# Patient Record
Sex: Female | Born: 1965 | Race: White | Hispanic: No | Marital: Married | Smoking: Former smoker
Health system: Southern US, Community
[De-identification: ages and names within clinical notes are randomized; demographics above are authoritative.]

## PROBLEM LIST (undated history)

## (undated) DIAGNOSIS — I10 Essential (primary) hypertension: Secondary | ICD-10-CM

## (undated) DIAGNOSIS — Z8669 Personal history of other diseases of the nervous system and sense organs: Secondary | ICD-10-CM

## (undated) DIAGNOSIS — E282 Polycystic ovarian syndrome: Secondary | ICD-10-CM

## (undated) DIAGNOSIS — R011 Cardiac murmur, unspecified: Secondary | ICD-10-CM

## (undated) DIAGNOSIS — E7800 Pure hypercholesterolemia, unspecified: Secondary | ICD-10-CM

## (undated) DIAGNOSIS — K219 Gastro-esophageal reflux disease without esophagitis: Secondary | ICD-10-CM

## (undated) DIAGNOSIS — F419 Anxiety disorder, unspecified: Secondary | ICD-10-CM

## (undated) DIAGNOSIS — E119 Type 2 diabetes mellitus without complications: Secondary | ICD-10-CM

## (undated) DIAGNOSIS — E039 Hypothyroidism, unspecified: Secondary | ICD-10-CM

## (undated) DIAGNOSIS — D649 Anemia, unspecified: Secondary | ICD-10-CM

## (undated) DIAGNOSIS — J45909 Unspecified asthma, uncomplicated: Secondary | ICD-10-CM

## (undated) HISTORY — DX: Pure hypercholesterolemia, unspecified: E78.00

## (undated) HISTORY — DX: Cardiac murmur, unspecified: R01.1

## (undated) HISTORY — DX: Anxiety disorder, unspecified: F41.9

## (undated) HISTORY — DX: Polycystic ovarian syndrome: E28.2

## (undated) HISTORY — PX: KNEE SURGERY: SHX244

## (undated) HISTORY — DX: Unspecified asthma, uncomplicated: J45.909

## (undated) HISTORY — DX: Gastro-esophageal reflux disease without esophagitis: K21.9

## (undated) HISTORY — DX: Personal history of other diseases of the nervous system and sense organs: Z86.69

## (undated) HISTORY — DX: Hypothyroidism, unspecified: E03.9

## (undated) HISTORY — DX: Anemia, unspecified: D64.9

## (undated) HISTORY — DX: Type 2 diabetes mellitus without complications: E11.9

## (undated) HISTORY — DX: Essential (primary) hypertension: I10

## (undated) HISTORY — PX: OTHER SURGICAL HISTORY: SHX169

---

## 2000-02-25 ENCOUNTER — Other Ambulatory Visit: Admission: RE | Admit: 2000-02-25 | Discharge: 2000-02-25 | Payer: Self-pay | Admitting: Family Medicine

## 2000-10-03 ENCOUNTER — Other Ambulatory Visit: Admission: RE | Admit: 2000-10-03 | Discharge: 2000-10-03 | Payer: Self-pay | Admitting: Obstetrics and Gynecology

## 2000-10-17 ENCOUNTER — Encounter (INDEPENDENT_AMBULATORY_CARE_PROVIDER_SITE_OTHER): Payer: Self-pay | Admitting: Specialist

## 2000-10-17 ENCOUNTER — Other Ambulatory Visit: Admission: RE | Admit: 2000-10-17 | Discharge: 2000-10-17 | Payer: Self-pay | Admitting: Obstetrics and Gynecology

## 2000-10-26 ENCOUNTER — Encounter: Payer: Self-pay | Admitting: Obstetrics and Gynecology

## 2000-10-26 ENCOUNTER — Ambulatory Visit (HOSPITAL_COMMUNITY): Admission: RE | Admit: 2000-10-26 | Discharge: 2000-10-26 | Payer: Self-pay | Admitting: Obstetrics and Gynecology

## 2001-03-25 ENCOUNTER — Inpatient Hospital Stay (HOSPITAL_COMMUNITY): Admission: AD | Admit: 2001-03-25 | Discharge: 2001-03-25 | Payer: Self-pay | Admitting: Obstetrics and Gynecology

## 2001-04-24 ENCOUNTER — Inpatient Hospital Stay (HOSPITAL_COMMUNITY): Admission: AD | Admit: 2001-04-24 | Discharge: 2001-04-24 | Payer: Self-pay | Admitting: Obstetrics and Gynecology

## 2001-05-22 ENCOUNTER — Ambulatory Visit (HOSPITAL_COMMUNITY): Admission: RE | Admit: 2001-05-22 | Discharge: 2001-05-22 | Payer: Self-pay | Admitting: Obstetrics and Gynecology

## 2001-05-22 ENCOUNTER — Encounter: Payer: Self-pay | Admitting: Obstetrics and Gynecology

## 2001-05-22 ENCOUNTER — Inpatient Hospital Stay (HOSPITAL_COMMUNITY): Admission: AD | Admit: 2001-05-22 | Discharge: 2001-05-22 | Payer: Self-pay | Admitting: Obstetrics and Gynecology

## 2001-06-29 ENCOUNTER — Encounter (INDEPENDENT_AMBULATORY_CARE_PROVIDER_SITE_OTHER): Payer: Self-pay

## 2001-06-29 ENCOUNTER — Ambulatory Visit (HOSPITAL_COMMUNITY): Admission: RE | Admit: 2001-06-29 | Discharge: 2001-06-29 | Payer: Self-pay | Admitting: Obstetrics and Gynecology

## 2001-06-30 ENCOUNTER — Encounter: Admission: RE | Admit: 2001-06-30 | Discharge: 2001-06-30 | Payer: Self-pay | Admitting: Family Medicine

## 2001-06-30 ENCOUNTER — Encounter: Payer: Self-pay | Admitting: Family Medicine

## 2001-07-16 ENCOUNTER — Inpatient Hospital Stay (HOSPITAL_COMMUNITY): Admission: AD | Admit: 2001-07-16 | Discharge: 2001-07-16 | Payer: Self-pay | Admitting: Obstetrics and Gynecology

## 2001-07-19 ENCOUNTER — Inpatient Hospital Stay (HOSPITAL_COMMUNITY): Admission: AD | Admit: 2001-07-19 | Discharge: 2001-07-19 | Payer: Self-pay | Admitting: Obstetrics and Gynecology

## 2001-07-21 ENCOUNTER — Inpatient Hospital Stay (HOSPITAL_COMMUNITY): Admission: AD | Admit: 2001-07-21 | Discharge: 2001-07-21 | Payer: Self-pay | Admitting: Obstetrics and Gynecology

## 2001-07-22 ENCOUNTER — Inpatient Hospital Stay (HOSPITAL_COMMUNITY): Admission: AD | Admit: 2001-07-22 | Discharge: 2001-07-22 | Payer: Self-pay | Admitting: Obstetrics and Gynecology

## 2001-08-12 ENCOUNTER — Inpatient Hospital Stay (HOSPITAL_COMMUNITY): Admission: AD | Admit: 2001-08-12 | Discharge: 2001-08-12 | Payer: Self-pay | Admitting: Obstetrics and Gynecology

## 2001-08-13 ENCOUNTER — Inpatient Hospital Stay (HOSPITAL_COMMUNITY): Admission: AD | Admit: 2001-08-13 | Discharge: 2001-08-13 | Payer: Self-pay | Admitting: Obstetrics and Gynecology

## 2002-07-03 ENCOUNTER — Other Ambulatory Visit: Admission: RE | Admit: 2002-07-03 | Discharge: 2002-07-03 | Payer: Self-pay | Admitting: *Deleted

## 2002-11-23 ENCOUNTER — Encounter: Admission: RE | Admit: 2002-11-23 | Discharge: 2002-11-23 | Payer: Self-pay | Admitting: Obstetrics and Gynecology

## 2003-01-10 ENCOUNTER — Inpatient Hospital Stay (HOSPITAL_COMMUNITY): Admission: AD | Admit: 2003-01-10 | Discharge: 2003-01-13 | Payer: Self-pay | Admitting: Obstetrics and Gynecology

## 2003-02-19 ENCOUNTER — Other Ambulatory Visit: Admission: RE | Admit: 2003-02-19 | Discharge: 2003-02-19 | Payer: Self-pay | Admitting: Obstetrics and Gynecology

## 2003-04-12 ENCOUNTER — Encounter: Admission: RE | Admit: 2003-04-12 | Discharge: 2003-04-12 | Payer: Self-pay | Admitting: Obstetrics and Gynecology

## 2003-04-12 ENCOUNTER — Encounter: Payer: Self-pay | Admitting: Obstetrics and Gynecology

## 2003-07-26 ENCOUNTER — Encounter: Admission: RE | Admit: 2003-07-26 | Discharge: 2003-07-26 | Payer: Self-pay | Admitting: Otolaryngology

## 2003-07-26 ENCOUNTER — Encounter: Payer: Self-pay | Admitting: Otolaryngology

## 2004-01-01 ENCOUNTER — Encounter: Admission: RE | Admit: 2004-01-01 | Discharge: 2004-01-01 | Payer: Self-pay | Admitting: Otolaryngology

## 2004-06-10 ENCOUNTER — Other Ambulatory Visit: Admission: RE | Admit: 2004-06-10 | Discharge: 2004-06-10 | Payer: Self-pay | Admitting: Obstetrics and Gynecology

## 2004-08-20 ENCOUNTER — Encounter: Admission: RE | Admit: 2004-08-20 | Discharge: 2004-08-20 | Payer: Self-pay | Admitting: Family Medicine

## 2005-01-01 ENCOUNTER — Encounter: Admission: RE | Admit: 2005-01-01 | Discharge: 2005-01-01 | Payer: Self-pay | Admitting: Gastroenterology

## 2005-07-07 ENCOUNTER — Ambulatory Visit: Payer: Self-pay | Admitting: Endocrinology

## 2005-09-25 ENCOUNTER — Emergency Department (HOSPITAL_COMMUNITY): Admission: EM | Admit: 2005-09-25 | Discharge: 2005-09-25 | Payer: Self-pay | Admitting: Emergency Medicine

## 2005-11-05 ENCOUNTER — Encounter: Admission: RE | Admit: 2005-11-05 | Discharge: 2005-11-05 | Payer: Self-pay | Admitting: Obstetrics and Gynecology

## 2005-11-12 ENCOUNTER — Ambulatory Visit (HOSPITAL_COMMUNITY): Admission: RE | Admit: 2005-11-12 | Discharge: 2005-11-12 | Payer: Self-pay | Admitting: Sports Medicine

## 2006-10-25 ENCOUNTER — Ambulatory Visit: Payer: Self-pay | Admitting: Endocrinology

## 2006-10-25 LAB — CONVERTED CEMR LAB
Chloride: 104 meq/L (ref 96–112)
Creatinine, Ser: 0.8 mg/dL (ref 0.4–1.2)
Glucose, Bld: 116 mg/dL — ABNORMAL HIGH (ref 70–99)
Sodium: 138 meq/L (ref 135–145)

## 2006-12-29 ENCOUNTER — Encounter: Admission: RE | Admit: 2006-12-29 | Discharge: 2006-12-29 | Payer: Self-pay | Admitting: Obstetrics and Gynecology

## 2007-04-28 ENCOUNTER — Ambulatory Visit (HOSPITAL_COMMUNITY): Admission: RE | Admit: 2007-04-28 | Discharge: 2007-04-28 | Payer: Self-pay | Admitting: Obstetrics and Gynecology

## 2007-06-20 ENCOUNTER — Encounter: Payer: Self-pay | Admitting: Endocrinology

## 2007-06-20 DIAGNOSIS — E119 Type 2 diabetes mellitus without complications: Secondary | ICD-10-CM | POA: Insufficient documentation

## 2007-06-20 DIAGNOSIS — J45909 Unspecified asthma, uncomplicated: Secondary | ICD-10-CM | POA: Insufficient documentation

## 2007-06-20 DIAGNOSIS — K219 Gastro-esophageal reflux disease without esophagitis: Secondary | ICD-10-CM

## 2007-06-20 HISTORY — DX: Unspecified asthma, uncomplicated: J45.909

## 2007-06-20 HISTORY — DX: Gastro-esophageal reflux disease without esophagitis: K21.9

## 2007-06-20 HISTORY — DX: Type 2 diabetes mellitus without complications: E11.9

## 2007-08-17 ENCOUNTER — Inpatient Hospital Stay (HOSPITAL_COMMUNITY): Admission: AD | Admit: 2007-08-17 | Discharge: 2007-08-18 | Payer: Self-pay | Admitting: Obstetrics and Gynecology

## 2007-08-28 ENCOUNTER — Encounter (INDEPENDENT_AMBULATORY_CARE_PROVIDER_SITE_OTHER): Payer: Self-pay | Admitting: Obstetrics and Gynecology

## 2007-08-28 ENCOUNTER — Inpatient Hospital Stay (HOSPITAL_COMMUNITY): Admission: AD | Admit: 2007-08-28 | Discharge: 2007-09-01 | Payer: Self-pay | Admitting: Obstetrics and Gynecology

## 2008-01-24 ENCOUNTER — Telehealth (INDEPENDENT_AMBULATORY_CARE_PROVIDER_SITE_OTHER): Payer: Self-pay | Admitting: *Deleted

## 2008-01-26 ENCOUNTER — Ambulatory Visit: Payer: Self-pay | Admitting: Endocrinology

## 2008-01-26 DIAGNOSIS — E039 Hypothyroidism, unspecified: Secondary | ICD-10-CM | POA: Insufficient documentation

## 2008-01-26 HISTORY — DX: Hypothyroidism, unspecified: E03.9

## 2008-01-26 LAB — CONVERTED CEMR LAB
BUN: 10 mg/dL (ref 6–23)
CO2: 27 meq/L (ref 19–32)
Chloride: 107 meq/L (ref 96–112)
Creatinine, Ser: 0.9 mg/dL (ref 0.4–1.2)
Glucose, Bld: 113 mg/dL — ABNORMAL HIGH (ref 70–99)
Hgb A1c MFr Bld: 6.1 % — ABNORMAL HIGH (ref 4.6–6.0)

## 2008-02-09 ENCOUNTER — Ambulatory Visit (HOSPITAL_COMMUNITY): Admission: RE | Admit: 2008-02-09 | Discharge: 2008-02-09 | Payer: Self-pay | Admitting: Obstetrics and Gynecology

## 2008-04-04 ENCOUNTER — Encounter: Admission: RE | Admit: 2008-04-04 | Discharge: 2008-04-04 | Payer: Self-pay | Admitting: Obstetrics and Gynecology

## 2008-11-15 ENCOUNTER — Ambulatory Visit: Payer: Self-pay | Admitting: Endocrinology

## 2008-11-15 DIAGNOSIS — I10 Essential (primary) hypertension: Secondary | ICD-10-CM | POA: Insufficient documentation

## 2008-11-15 HISTORY — DX: Essential (primary) hypertension: I10

## 2008-11-20 ENCOUNTER — Ambulatory Visit: Payer: Self-pay | Admitting: Endocrinology

## 2008-11-20 LAB — CONVERTED CEMR LAB: TSH: 25.26 microintl units/mL — ABNORMAL HIGH (ref 0.35–5.50)

## 2008-11-26 ENCOUNTER — Telehealth (INDEPENDENT_AMBULATORY_CARE_PROVIDER_SITE_OTHER): Payer: Self-pay | Admitting: *Deleted

## 2008-11-28 ENCOUNTER — Ambulatory Visit: Payer: Self-pay | Admitting: Endocrinology

## 2008-12-01 LAB — CONVERTED CEMR LAB: Cortisol, Plasma: 1.1 ug/dL

## 2008-12-03 ENCOUNTER — Telehealth (INDEPENDENT_AMBULATORY_CARE_PROVIDER_SITE_OTHER): Payer: Self-pay | Admitting: *Deleted

## 2008-12-05 ENCOUNTER — Ambulatory Visit: Payer: Self-pay | Admitting: Endocrinology

## 2008-12-09 ENCOUNTER — Encounter: Payer: Self-pay | Admitting: Endocrinology

## 2008-12-17 LAB — CONVERTED CEMR LAB: Volume, Urine-CORTUR: 1200 mL

## 2009-01-08 ENCOUNTER — Encounter: Payer: Self-pay | Admitting: Endocrinology

## 2009-01-29 ENCOUNTER — Ambulatory Visit (HOSPITAL_COMMUNITY): Admission: RE | Admit: 2009-01-29 | Discharge: 2009-01-29 | Payer: Self-pay | Admitting: Surgery

## 2009-01-29 ENCOUNTER — Encounter: Admission: RE | Admit: 2009-01-29 | Discharge: 2009-01-29 | Payer: Self-pay | Admitting: Surgery

## 2009-02-11 ENCOUNTER — Ambulatory Visit (HOSPITAL_COMMUNITY): Admission: RE | Admit: 2009-02-11 | Discharge: 2009-02-11 | Payer: Self-pay | Admitting: Surgery

## 2009-04-24 ENCOUNTER — Encounter: Admission: RE | Admit: 2009-04-24 | Discharge: 2009-04-24 | Payer: Self-pay | Admitting: Obstetrics and Gynecology

## 2009-04-24 ENCOUNTER — Encounter: Admission: RE | Admit: 2009-04-24 | Discharge: 2009-07-23 | Payer: Self-pay | Admitting: Surgery

## 2009-05-13 ENCOUNTER — Ambulatory Visit (HOSPITAL_COMMUNITY): Admission: RE | Admit: 2009-05-13 | Discharge: 2009-05-14 | Payer: Self-pay | Admitting: Surgery

## 2009-05-13 HISTORY — PX: LAPAROSCOPIC GASTRIC BANDING: SHX1100

## 2009-05-16 ENCOUNTER — Telehealth: Payer: Self-pay | Admitting: Endocrinology

## 2009-05-20 ENCOUNTER — Ambulatory Visit: Payer: Self-pay | Admitting: Endocrinology

## 2009-05-21 LAB — CONVERTED CEMR LAB: TSH: 7.07 microintl units/mL — ABNORMAL HIGH (ref 0.35–5.50)

## 2010-01-05 ENCOUNTER — Ambulatory Visit: Payer: Self-pay | Admitting: Endocrinology

## 2010-01-05 DIAGNOSIS — E7800 Pure hypercholesterolemia, unspecified: Secondary | ICD-10-CM

## 2010-01-05 HISTORY — DX: Pure hypercholesterolemia, unspecified: E78.00

## 2010-01-05 LAB — CONVERTED CEMR LAB
HDL: 52.2 mg/dL (ref >39.00)
TSH: 2.86 microintl units/mL (ref 0.35–5.50)
Total CHOL/HDL Ratio: 3

## 2010-01-16 ENCOUNTER — Ambulatory Visit (HOSPITAL_COMMUNITY): Admission: RE | Admit: 2010-01-16 | Discharge: 2010-01-16 | Payer: Self-pay | Admitting: Obstetrics and Gynecology

## 2010-10-25 ENCOUNTER — Encounter: Payer: Self-pay | Admitting: Obstetrics and Gynecology

## 2010-10-25 ENCOUNTER — Encounter: Payer: Self-pay | Admitting: Gastroenterology

## 2010-11-05 NOTE — Assessment & Plan Note (Signed)
Summary: f/u appt/#/cd   Vital Signs:  Patient profile:   45 year old female Height:      62 inches (157.48 cm) Weight:      237.50 pounds (107.95 kg) O2 Sat:      98 % on Room air Temp:     99.0 degrees F (37.22 degrees C) oral Pulse rate:   74 / minute BP sitting:   128 / 72  (left arm) Cuff size:   large  Vitals Entered By: Josph Macho RMA (January 05, 2010 3:20 PM)  O2 Flow:  Room air CC: Follow-up visit/ pt states she is no longer taking Topamax/ CF   CC:  Follow-up visit/ pt states she is no longer taking Topamax/ CF.  History of Present Illness: pt says she has hit a "plateau," since her "lap-band" surgery.  she has lost 43 lbs so far, out of a goal of 75 lb.   she says her menses are not more frequent, but are heavier since the "lap-band" surgery. she says she has had a high cholesterol in the past, but not recently.  Current Medications (verified): 1)  Omeprazole 20 Mg Tbec (Omeprazole) .Marland Kitchen.. 1 By Mouth Once Daily 2)  Paxil Cr 25 Mg  Tb24 (Paroxetine Hcl) .... Take 1 By Mouth Qd 3)  Alprazolam 0.25 Mg  Tabs (Alprazolam) .... Take 1 By Mouth Once Daily Prn 4)  Synthroid 100 Mcg Tabs (Levothyroxine Sodium) .... Qd 5)  Topamax 25 Mg Tabs (Topiramate) .Marland Kitchen.. 1 Tab Daily To Prevent Migraines  Allergies (verified): 1)  ! Sulfa  Past History:  Past Medical History: Last updated: 06/20/2007 Diabetes mellitus, type II GERD Polycystic ovary syndrome  Review of Systems       she has regurgitation infrequently.  denies painful swallowing.  Physical Exam  General:  obese.  no distress  Neck:  no masses, thyromegaly, or abnormal cervical nodes Additional Exam:  LDL Cholesterol           87 mg/dL                    0-45 FastTSH                   2.86 uIU/mL                 0.35-5.50 Hemoglobin A1C            5.8 %      Impression & Recommendations:  Problem # 1:  HYPERCHOLESTEROLEMIA (ICD-272.0) well-controlled  Problem # 2:  HYPERTENSION  (ICD-401.9) well-controlled  Problem # 3:  HYPOTHYROIDISM (ICD-244.9) well-replaced  Problem # 4:  DIABETES MELLITUS, TYPE II (ICD-250.00) well-controlled on no medication  Medications Added to Medication List This Visit: 1)  Omeprazole 20 Mg Tbec (Omeprazole) .Marland Kitchen.. 1 by mouth once daily  Other Orders: TLB-Lipid Panel (80061-LIPID) TLB-TSH (Thyroid Stimulating Hormone) (84443-TSH) TLB-A1C / Hgb A1C (Glycohemoglobin) (83036-A1C) Est. Patient Level IV (40981)  Patient Instructions: 1)  tests are being ordered for you today.  a few days after the test(s), please call [phone] to hear your test results. 2)  pending the test results, please continue the same medications for now 3)  cc drs martin and grewal. 4)  Please schedule a follow-up appointment in 6 months.

## 2010-12-23 LAB — CBC
MCHC: 32.9 g/dL (ref 30.0–36.0)
RBC: 4.34 MIL/uL (ref 3.87–5.11)

## 2010-12-23 LAB — PREGNANCY, URINE: Preg Test, Ur: NEGATIVE

## 2011-01-09 LAB — COMPREHENSIVE METABOLIC PANEL
ALT: 118 U/L — ABNORMAL HIGH (ref 0–35)
Alkaline Phosphatase: 51 U/L (ref 39–117)
BUN: 15 mg/dL (ref 6–23)
CO2: 23 mEq/L (ref 19–32)
Calcium: 9.1 mg/dL (ref 8.4–10.5)
GFR calc non Af Amer: >60 mL/min (ref >60)
Glucose, Bld: 128 mg/dL — ABNORMAL HIGH (ref 70–99)
Sodium: 139 mEq/L (ref 135–145)
Total Protein: 7.6 g/dL (ref 6.0–8.3)

## 2011-01-09 LAB — GLUCOSE, CAPILLARY
Glucose-Capillary: 108 mg/dL — ABNORMAL HIGH (ref 70–99)
Glucose-Capillary: 119 mg/dL — ABNORMAL HIGH (ref 70–99)
Glucose-Capillary: 127 mg/dL — ABNORMAL HIGH (ref 70–99)
Glucose-Capillary: 173 mg/dL — ABNORMAL HIGH (ref 70–99)

## 2011-01-09 LAB — DIFFERENTIAL
Basophils Absolute: 0 10*3/uL (ref 0.0–0.1)
Basophils Relative: 1 % (ref 0–1)
Eosinophils Absolute: 0.1 10*3/uL (ref 0.0–0.7)
Lymphocytes Relative: 18 % (ref 12–46)
Lymphs Abs: 1.1 10*3/uL (ref 0.7–4.0)
Lymphs Abs: 1.3 10*3/uL (ref 0.7–4.0)
Monocytes Absolute: 0.5 10*3/uL (ref 0.1–1.0)
Monocytes Relative: 7 % (ref 3–12)
Neutro Abs: 5.2 10*3/uL (ref 1.7–7.7)
Neutro Abs: 5.5 10*3/uL (ref 1.7–7.7)
Neutrophils Relative %: 77 % (ref 43–77)

## 2011-01-09 LAB — CBC
HCT: 35.9 % — ABNORMAL LOW (ref 36.0–46.0)
HCT: 36.3 % (ref 36.0–46.0)
Hemoglobin: 11.7 g/dL — ABNORMAL LOW (ref 12.0–15.0)
Hemoglobin: 12 g/dL (ref 12.0–15.0)
MCHC: 32.9 g/dL (ref 30.0–36.0)
RBC: 4.11 MIL/uL (ref 3.87–5.11)
RBC: 4.21 MIL/uL (ref 3.87–5.11)
RDW: 16.3 % — ABNORMAL HIGH (ref 11.5–15.5)

## 2011-01-09 LAB — PREGNANCY, URINE: Preg Test, Ur: NEGATIVE

## 2011-01-27 ENCOUNTER — Other Ambulatory Visit: Payer: Self-pay | Admitting: Endocrinology

## 2011-02-16 NOTE — Op Note (Signed)
NAMEJAIRA, CANADY             ACCOUNT NO.:  [id number]   MEDICAL RECORD NO.:  [id number]          PATIENT TYPE:  INP   LOCATION:  9109                          FACILITY:  [facility]   PHYSICIAN:  Duke Salvia. Marcelle Overlie, M.D.DATE OF BIRTH:  Aug 28, 1966   DATE OF PROCEDURE:  08/28/2007  DATE OF DISCHARGE:                               OPERATIVE REPORT   PREOPERATIVE DIAGNOSES:  1. Previous cesarean section.  2. For repeat cesarean section.   POSTOPERATIVE DIAGNOSES:  1. Previous cesarean section.  2. For repeat cesarean section.   PROCEDURE:  Repeat low transverse cesarean section.   SURGEON:  Duke Salvia. Marcelle Overlie, M.D.   ANESTHESIA:  Spinal.   COMPLICATIONS:  None.   DRAINS:  Foley catheter.   BLOOD LOSS:  800   PROCEDURE AND FINDINGS:  The patient was taken to the operating room and  after an adequate level of spinal anesthetic was obtained with the  patient in a left tilt position, the abdomen was prepped and draped in  the usual manner for sterile abdominal procedures.  A Foley catheter was  positioned, draining clear urine.  A Pfannenstiel incision was made  through the old scar, which was well healed; this was carried down to  the fascia, which was incised and extended transversely.  Rectus muscle  was divided in the midline.  Peritoneum was entered superiorly without  incident and extended vertically.  Several omental adhesions were doubly  clamped, divided and free-tied to advance the adherent omentum  superiorly.  Once this was accomplished, the bladder blade was inserted.  Vesicouterine serosa was then incised and the bladder was bluntly and  sharply dissected below, bladder blade repositioned.   A transverse incision was made in the lower segment and extended with  blunt dissection.  The patient was delivered of a 7-pound 10-ounce female,  cord pH 7.35, Apgars 9 and 9.  The infant was suctioned, cord clamped  and infant passed to the pediatric team for further care.  The  placenta  was delivered manually intact, sent to Pathology.  Uterus was  exteriorized and cavity wiped clean with a laparotomy pack.  Closure was  obtained with a first layer of 0 chromic followed by an imbricating  layer of 0 chromic; this was hemostatic.  The bladder flap area was  intact and hemostatic.  Tubes and ovaries were normal.  Prior to  closure, sponge, needle and instrument counts were reported as correct  x2.  Peritoneum was closed with a running 3-0 Vicryl suture.  Fascia was  closed from laterally to midline  on either side with a 0 PDS suture.  A 3-0 Vicryl was used in running  fashion to reapproximate the subcutaneous tissue, which was hemostatic.  Clips used on the skin.  She did receive preop Ancef 1 g IV Pitocin and  after the cord was clamped.  Clear urine was noted at the end of the  case.  Mother and baby were doing well at that point.      Richard M. Marcelle Overlie, M.D.  Electronically Signed     RMH/MEDQ  D:  08/28/2007  T:  08/29/2007  Job:  [id number]

## 2011-02-16 NOTE — Op Note (Signed)
Jordan Holloway, Jordan Holloway             ACCOUNT NO.:  [id number]   MEDICAL RECORD NO.:  [id number]          PATIENT TYPE:  OIB   LOCATION:  1525                         FACILITY:  [facility]   PHYSICIAN:  Thornton Park. Daphine Deutscher, MD  DATE OF BIRTH:  [birth year]   DATE OF PROCEDURE:  05/13/2009  DATE OF DISCHARGE:                               OPERATIVE REPORT   PREOPERATIVE INDICATIONS:  A 45 year old lady, BMI 49, positive  gastroesophageal reflux disease and small hiatal hernia with diabetes  mellitus.   PROCEDURE:  Laparoscopic adjustable gastric banding (APL) with hiatal  hernia repair (two posterior sutures).   SURGEON:  Luretha Murphy, M.D.   ASSISTANT:  Ovidio Kin, M.D.   ANESTHESIA:  General endotracheal.   DESCRIPTION OF PROCEDURE:  [name] was taken to room 1 on May 13, 2009, given general anesthesia.  The abdomen was prepped with a Techni-  Care equivalent and draped sterilely.  I entered the abdomen through the  left upper quadrant using an OptiVu 12 entering without difficulty and  then insufflating.  Once insufflation was established, pneumoperitoneum  was present.  I placed standard trocar placements including 11-12 to the  left of the umbilicus with the camera and two operating ports on the  right including a 15 upper and 12 lower and 5 mm in the upper midline  for the Nathanson's retractor.  Dr. Ezzard Standing placed a second 5 mm lateral  on the left side.  The patient had a slightly fatty liver.  We were able  to expose the proximal foregut, we then did a complete dissection and  opened up the window on the right and then taking down the  phrenoesophageal ligament and refining a hiatal hernia and getting that  reduced.  With that reduced, I then repaired the hiatus posteriorly with  two sutures using the Endo stitch and Ti-Knots.   Next we went ahead and went down a little lower, passed the band passer  and placed an APL band which was engaged and plicated anteriorly with  three sutures using Surgidac and Ti-Knots.  The port was then connected  to the  port and placed in the subcutaneous tissue on the right and then closed  with 4-0 Vicryl subcutaneously with Benzoin and Steri-Strips.  Ports  were all injected with Marcaine.  The patient seemed to tolerate the  procedure well and was taken to recovery room in satisfactory condition.      Thornton Park Daphine Deutscher, MD  Electronically Signed     MBM/MEDQ  D:  05/13/2009  T:  05/13/2009  Job:  [id number]   cc:   Gregary Signs A. Everardo All, MD  [address]  [place]  [place] [address]   [name], M.D.  Fax: [phone]

## 2011-02-16 NOTE — Op Note (Signed)
NAME:  Jordan Holloway, Jordan Holloway             ACCOUNT NO.:  [id number]   MEDICAL RECORD NO.:  [id number]          PATIENT TYPE:  AMB   LOCATION:  SDC                           FACILITY:  [facility]   PHYSICIAN:  Michelle L. Grewal, M.D.DATE OF BIRTH:  [birth year]   DATE OF PROCEDURE:  02/09/2008  DATE OF DISCHARGE:                               OPERATIVE REPORT   PREOPERATIVE DIAGNOSIS:  Retained intrauterine device.   POSTOPERATIVE DIAGNOSIS:  Retained intrauterine device.   PROCEDURE:  Cervical dilation and intrauterine device removal.   SURGEON:  Michelle L. Vincente Poli, MD   ANESTHESIA:  MAC.   FINDINGS:  Retained IUD.   EBL:  Minimal.   PROCEDURE:  The patient was taken to the operating room.  After informed  consent was obtained, she was then prepped and draped in the usual  sterile fashion.  In-and-out catheter was used to empty the bladder.  The cervix was grasped with a tenaculum.  Paracervical block was  performed.  The cervical internal os was gently dilated using Pratt  dilators.  A sharp curette was inserted and the IUD was removed with a  second attempt without difficulty.  Strings were intact.  The IUD was  intact.  All instruments were removed from the vagina.  All sponge, lap,  and instrument counts were correct x2.  The patient went to recovery  room in stable condition.      Michelle L. Vincente Poli, M.D.  Electronically Signed     MLG/MEDQ  D:  02/09/2008  T:  02/10/2008  Job:  [id number]

## 2011-02-19 NOTE — Consult Note (Signed)
[facility]                          ENDOCRINOLOGY CONSULTATION   NAME:Holloway, Jordan AYUSO                    MRN:          [id number]  DATE:10/25/2006                            DOB:          07-30-1966    REASON FOR VISIT:  Follow up polycystic ovary syndrome.   HISTORY OF PRESENT ILLNESS:  A 45 year old woman who is currently taking  her extended-release metformin 1000 mg a day.  She states she has a  limited amount of time left to try to begin her pregnancy and she wants  to pursue this.   PAST MEDICAL HISTORY:  Otherwise healthy.   REVIEW OF SYSTEMS:  She has gained several pounds since she was here  last year.   PHYSICAL EXAMINATION:  VITAL SIGNS:  Blood pressure 143/88, heart rate  92, temperature is 98.7, the weight is 253.  GENERAL:  No distress.  EXTREMITIES:  No edema.   LABORATORY STUDIES:  BMET normal except for random glucose 116.  Hemoglobin A1c 6.0.   IMPRESSION:  1. Polycystic ovary syndrome.  2. Although the hemoglobin A1c is not an official criterion for      diabetes, virtually all patients with a hemoglobin A1c of 6.0 do      fulfill official criteria when exhaustively tested.   PLAN:  1. Add Actos 45 mg a day.  2. Increase Glucophage XR 1000 mg twice a day.  3. Blood pressure check with Dr. Arvilla Market soon.     Sean A. Everardo All, MD  Electronically Signed    SAE/MedQ  DD: 10/27/2006  DT: 10/27/2006  Job #: [id number]   cc:   Jordan Holloway, M.D.  Jordan Holloway, M.D.

## 2011-02-19 NOTE — Discharge Summary (Signed)
NAMEJAQUELINE, UBER             ACCOUNT NO.:  [id number]   MEDICAL RECORD NO.:  [id number]          PATIENT TYPE:  INP   LOCATION:  9109                          FACILITY:  [facility]   PHYSICIAN:  Julio Sicks, N.P.     DATE OF BIRTH:  1965-12-05   DATE OF ADMISSION:  08/28/2007  DATE OF DISCHARGE:  09/01/2007                               DISCHARGE SUMMARY   ADMISSION DATE:  1. Intrauterine pregnancy at term.  2. Previous cesarean section, desires repeat.  3. Gestational diabetes managed with metformin.  4. Nonreassuring fetal heart tones.   DISCHARGE DIAGNOSIS:  1. Status post low transverse cesarean section.  2. Viable female infant.   PROCEDURE:  Repeat low transverse cesarean section.   REASON FOR ADMISSION:  Please see written H&P.   HOSPITAL COURSE:  The patient was a 45 year old gravida 2, para 1, who  was admitted to [facility] for a repeat low transverse  cesarean section.  The patient had been scheduled for a repeat cesarean  delivery in approximately 2 days, however, the patient had been seen in  the office on the day of admission where an audible deceleration of  fetal heart tones were auscultated.  The patient was now admitted for  delivery.  The patient was then transferred to the operating room where  spinal anesthesia was administered without difficulty.  A low transverse  incision was made with delivery of a viable female infant weighing 7  pounds 10 ounces, with Apgars of 9 at one minute and 9 at five minutes.  Arterial cord pH was 7.35.  The patient tolerated the procedure well and  was taken to the recovery room in stable condition.   Postoperative day #1 the patient was without complaint.  Vital signs  were stable.  She is afebrile.  Abdomen soft with good return of bowel  function.  Fundus is firm and nontender.  Abdominal dressing was noted  to be clean, dry and intact.   Laboratory findings revealed hemoglobin of 10.4, platelet count  133,000,  WBC count of 6.9.  Blood type was known to be A+.  Fasting blood sugar  was 143 mg/dL.   On postoperative day #2 the patient was without complaint.  Vital signs  were stable.  She is afebrile.  Abdomen soft.  Fundus firm and  nontender.  Incision was clean, dry and intact.  She is ambulating well.  On postoperative day #3 the patient continued to be without complaint.  Vital signs remained stable.  She is afebrile.  Fundus firm and  nontender.  On postoperative day #4 the patient was without complaint.  Vital signs were stable.  Fundus firm and nontender.  Incision was  clean, dry and intact.  Staples were left in place.  Discharge  instructions were reviewed and the patient was later discharged home.   CONDITION ON DISCHARGE:  Stable.   DIET:  Regular as tolerated.   ACTIVITY:  No heavy lifting, no driving times two weeks, no vaginal  entry.   FOLLOW UP:  Patient is to followup in the office in 2-3 days for staple  removal.  She is to call for temperature greater than 100 degrees,  persistent nausea, vomiting, heavy vaginal bleeding and/or redness or  drainage from incisional site.   DISCHARGE MEDICATIONS:  1. Tylox #30 1 to 2 every 4 to 6 hours p.r.n. pain.  2. Ibuprofen 600 mg every 6 hours.  3. Prenatal vitamins 1 p.o. daily.  4. Colace 1 p.o. daily p.r.n.      Julio Sicks, N.P.     CC/MEDQ  D:  10/04/2007  T:  10/04/2007  Job:  [id number]

## 2011-02-19 NOTE — Op Note (Signed)
NAME:  Jordan Holloway, Jordan Holloway                       ACCOUNT NO.:  [id number]   MEDICAL RECORD NO.:  [id number]                   PATIENT TYPE:  INP   LOCATION:  9129                                 FACILITY:  [facility]   PHYSICIAN:  Michelle L. Vincente Poli, M.D.            DATE OF BIRTH:  01-27-1966   DATE OF PROCEDURE:  01/10/2003  DATE OF DISCHARGE:                                 OPERATIVE REPORT   PREOPERATIVE DIAGNOSES:  1. Intrauterine pregnancy at 38 weeks.  2. Gestational diabetes on insulin.  3. Breech presentation.   POSTOPERATIVE DIAGNOSES:  1. Intrauterine pregnancy at 38 weeks.  2. Gestational diabetes on insulin.  3. Breech presentation.   PROCEDURE:  Primary low transverse cesarean section.   SURGEON:  Jerre Simon, M.D.   ANESTHESIA:  Spinal.   FINDINGS:  Female infant, Apgars 9 at one minute, 9 at five minutes despite  breech presentation.   ESTIMATED BLOOD LOSS:  700 cc.   PROCEDURE:  The patient was taken to the operating room.  She was then given  her spinal without incident.  She was then placed in the supine position  with a dorsal leftward tilt.  She was then prepped and draped in usual  sterile fashion after Foley catheter was inserted into the bladder.  Using a  scalpel a low transverse incision was made and carried down to the fascia.  The fascia was scored in the midline and extended laterally.  A Pfannenstiel  incision was then developed.  The rectus muscles were separated in the  midline and the peritoneum was entered bluntly.  The peritoneal incision was  then stretched.  The bladder blade was inserted.  The lower uterine segment  was identified and the bladder flap was then created sharply and then  digitally.  The bladder blade was readjusted.  A low transverse incision was  made and extended laterally.  The baby was in frank breech presentation and  was delivered from that easily, was a female infant with Apgars 9 at minute  and 9 at five minutes,  and had a loose nuchal cord.  Cord blood was  obtained.  The placenta was manually removed.  The uterine was cleared of  all clots and debris.  The uterus was closed in a single layer using 0  chromic in continuous running, locked stitch.  An irrigation was performed.  The incision was hemostatic.  Peritoneum was closed using 0Vicryl in  continuous running stitch and the rectus muscle were reapproximated using  the same 0Vicryl.  Reirrigation was performed.  The fascia was hemostatic.  The rectus muscle were hemostatic.  The fascia was closed using 0Vicryl in  continuous running stitch  starting in each corner and meeting in midline.  After irrigation in the  subcutaneous layer, the skin was closed with staples.  All sponge, lap, and  instrument counts were correct times two.  The patient tolerated the  procedure well  and went to recovery room in stable condition.                                                Michelle L. Vincente Poli, M.D.    Florestine Avers  D:  01/10/2003  T:  01/11/2003  Job:  [id number]

## 2011-02-19 NOTE — Discharge Summary (Signed)
NAME:  Jordan Holloway, Jordan Holloway                       ACCOUNT NO.:  [id number]   MEDICAL RECORD NO.:  [id number]                   PATIENT TYPE:  INP   LOCATION:  9129                                 FACILITY:  [facility]   PHYSICIAN:  Freddy Finner, M.D.                DATE OF BIRTH:  Apr 24, 1966   DATE OF ADMISSION:  01/10/2003  DATE OF DISCHARGE:  01/13/2003                                 DISCHARGE SUMMARY   ADMITTING DIAGNOSES:  1. Intrauterine pregnancy at 58 weeks estimated gestational age.  2. Breech presentation.  3. Gestational diabetes on insulin.   DISCHARGE DIAGNOSES:  1. Status post low transverse cesarean section secondary to a breech     presentation.  2. Viable female infant.   PROCEDURE:  Primary low transverse cesarean section.   REASON FOR ADMISSION:  Please see written H&P.   HOSPITAL COURSE:  The patient is a 45 year old gravida 1, para 0, that was  admitted to [facility] for scheduled cesarean delivery.  The patient had breech presentation which was confirmed by ultrasound on the  morning of admission.  Pregnancy had been complicated also by gestational  diabetes.  The patient has been receiving NPH insulin at h.s.  On the a.m.  of her surgery, the patient was prepped accordingly and taken to the  operating room where spinal anesthesia was administered without difficulty.  A low transverse incision was made with the delivery of a viable female  infant weighing 6 pounds and 1 ounce with Apgars of 9 at one minute, 9 at  five minutes.  The patient tolerated the procedure well and was taken to the  recovery room in stable condition.  On postoperative day one, the patient  was doing well.  Vital signs were stable.  Fundus was firm.  Abdominal  dressing was clean, dry, and intact.  Labs revealed a hemoglobin of 9.4,  platelet count of 159,000, WBC count of 8.1.  On postoperative day two,  vital signs were stable.  Fundus was firm and nontender.  Abdominal  dressing  was removed and revealed an incision that was clean, dry, and intact.  The  patient was tolerating a regular diet without complaints of nausea and  vomiting.  She was ambulating well.  Labs revealed a hemoglobin of 9.2,  platelet count of 176,000, WBC count of 7.0.  Blood sugars had remained  stable between 122 and 143.  On postoperative day three, the patient was  afebrile.  Fundus was firm and nontender.  Incision was clean, dry, and  intact.  Staples were removed and the patient was discharged home.   CONDITION ON DISCHARGE:  Good.   DIET:  Regular, as tolerated.   ACTIVITY:  No heavy lifting.  No driving x2 weeks.  No vaginal entry.   FOLLOW UP:  The patient is to follow up in the office in one to two weeks  for an incision  check.  She is to follow up with temperature greater than  100 degrees, persistent nausea and vomiting, heavy vaginal bleeding, and/or  redness or drainage of incisional site.   DISCHARGE MEDICATIONS:  1. Ibuprofen 600 mg every six hours.  2. Percocet 325, #30, one p.o. every 4-6h p.r.n. pain.  3. Prenatal vitamins twice a day x two weeks, and then one p.o. daily.  4. Colace one p.o. daily p.r.n.     Julio Sicks, N.P.                        Freddy Finner, M.D.    CC/MEDQ  D:  02/28/2003  T:  02/28/2003  Job:  [id number]

## 2011-02-19 NOTE — Op Note (Signed)
[facility]  Patient:    Jordan Holloway, Jordan Holloway Visit Number: [id number] MRN: [id number]          Service Type: DSU Location: [facility] Attending Physician:  Sharon Mt Proc. Date: 06/29/01 Admit Date:  06/29/2001                             Operative Report  PREOPERATIVE DIAGNOSES:       1. Endometriosis suspected.                               2. Polycystic ovarian disease.  POSTOPERATIVE DIAGNOSES:      1. Polycystic ovarian disease.                               2. Left paraovarian cyst.                               3. Pelvic adhesions involving sigmoid colon                                  to broad ligament on the left.  OPERATIONS:                   1. Diagnostic laparoscopy with lysis of                                  pelvic adhesions.                               2. Excision of paraovarian cyst.                               3. Chromotubation.  SURGEON:                      Daniel L. Eda Paschal, M.D.  ANESTHESIA:                   General endotracheal.  INDICATIONS:                  Patient is a 44 year old gravida 2, para 0, AB 1 who had been evaluated in my office for polycystic ovarian syndrome and possible endometriosis with dysmenorrhea.  The patient had also been interested in getting pregnant and so far had been unsuccessful with ovulation induction and metformin.  As a result of the above issues including dysmenorrhea she now enters the hospital for laparoscopy and appropriate therapy.  FINDINGS:                     At the time of laparoscopy patient did not have any endometriosis.  Her uterus was mobile, normal size and shape.  Right fallopian tube was normal with fluctuating fimbria.  Right ovary was significantly enlarged, but without neoplasm, consistent with polycystic ovary.  Left ovary was similar to the right ovary without neoplasm, but enlarged.  Left fallopian tube had a very large paraovarian cyst on  a broad-based pedicle near the fimbria.  Sigmoid colon was adherent to the broad ligament on the left, it was adherent at the mesenteric site.  When Indigo Carmine was introduced it spilled freely and promptly from both fallopian tubes.  DESCRIPTION OF PROCEDURE:     After adequate general endotracheal anesthesia the patient was placed in the dorsal lithotomy position, prepped and draped in the usual sterile manner.  A Jarcho cannula was placed in the uterus.  A Robinson catheter was placed in the bladder and the bladder was drained.  A pneumoperitoneum was created with a _____ needle.  The 10 mm probe was then placed subumbilically and through that the operating scope was placed.  A 5 mm port was placed in the midline suprapubically.  The above findings were noted.  The adhesions of bowel to the broad ligament were lysed with the Nd:YAG laser set at 12 watts power using a GR4 tip.  This was done atraumatically without injuring any surrounding tissue.  Once the bowel had been freed up there was no endometriosis there, the paraovarian cyst on the left was excised using a Nd:YAG laser and was sent to pathology for tissue diagnosis.  At the termination of the procedure there was absolutely no bleeding noted. All cul-de-sac fluid was removed, which had been used to irrigate through an Acupuncturist.  Indigo Carmine had previously been placed through the uterus and both tubes were patent without fimbrial bridging.  The pneumoperitoneum was evacuated.  The subumbilical incision was closed with 0-Vicryl and the skin incisions were closed with 4-0 monocril.  The patient tolerated the procedure well and left the operating room in satisfactory condition. Attending Physician:  Sharon Mt DD:  06/29/01 TD:  06/29/01 Job: [phone]

## 2011-04-05 ENCOUNTER — Other Ambulatory Visit: Payer: Self-pay

## 2011-04-05 MED ORDER — LEVOTHYROXINE SODIUM 100 MCG PO TABS: 100.0000 ug | ORAL_TABLET | Freq: Every day | ORAL | Status: DC

## 2011-04-22 ENCOUNTER — Ambulatory Visit (INDEPENDENT_AMBULATORY_CARE_PROVIDER_SITE_OTHER): Payer: BC Managed Care – PPO | Admitting: Endocrinology

## 2011-04-22 ENCOUNTER — Other Ambulatory Visit: Payer: Self-pay | Admitting: Endocrinology

## 2011-04-22 ENCOUNTER — Encounter: Payer: Self-pay | Admitting: Endocrinology

## 2011-04-22 ENCOUNTER — Other Ambulatory Visit (INDEPENDENT_AMBULATORY_CARE_PROVIDER_SITE_OTHER): Payer: BC Managed Care – PPO

## 2011-04-22 DIAGNOSIS — E7800 Pure hypercholesterolemia, unspecified: Secondary | ICD-10-CM

## 2011-04-22 DIAGNOSIS — E039 Hypothyroidism, unspecified: Secondary | ICD-10-CM

## 2011-04-22 DIAGNOSIS — I10 Essential (primary) hypertension: Secondary | ICD-10-CM

## 2011-04-22 DIAGNOSIS — E119 Type 2 diabetes mellitus without complications: Secondary | ICD-10-CM

## 2011-04-22 DIAGNOSIS — K219 Gastro-esophageal reflux disease without esophagitis: Secondary | ICD-10-CM

## 2011-04-22 LAB — BASIC METABOLIC PANEL
Chloride: 105 mEq/L (ref 96–112)
Creatinine, Ser: 0.7 mg/dL (ref 0.4–1.2)
Potassium: 4 mEq/L (ref 3.5–5.1)
Sodium: 140 mEq/L (ref 135–145)

## 2011-04-22 LAB — LDL CHOLESTEROL, DIRECT: Direct LDL: 157.7 mg/dL

## 2011-04-22 LAB — MICROALBUMIN / CREATININE URINE RATIO
Creatinine,U: 217.4 mg/dL
Microalb Creat Ratio: 0.3 mg/g (ref 0.0–30.0)
Microalb, Ur: 0.6 mg/dL (ref 0.0–1.9)

## 2011-04-22 LAB — HEMOGLOBIN A1C: Hgb A1c MFr Bld: 6.4 % (ref 4.6–6.5)

## 2011-04-22 LAB — TSH: TSH: 1.27 u[IU]/mL (ref 0.35–5.50)

## 2011-04-22 LAB — LIPID PANEL: Triglycerides: 181 mg/dL — ABNORMAL HIGH (ref 0.0–149.0)

## 2011-04-22 NOTE — Patient Instructions (Addendum)
blood tests are being ordered for you today.  please call [phone] to hear your test results.  You will be prompted to enter the 9-digit "MRN" number that appears at the top left of this page, followed by #.  Then you will hear the message. pending the test results, please continue the same medications for now Please make a follow-up appointment in 6 months. Please return for follow-up of your "lap-band" surgery. (update: i left message on phone-tree:  You should consider metformin, and lipitor).

## 2011-04-22 NOTE — Progress Notes (Signed)
  Subjective:    Patient ID: Jordan Holloway, female    DOB: November 03, 1965, 45 y.o.   MRN: [id number]  HPI Pt has lost approx 20 lbs since her "lap-band," in 2010.  Her weight loss has leveled off.   Dm: Prior to her surgery, she took metformin, but none since.  She has reg menses. She takes synthroid as rx'ed. She has never taken chol medication. Past Medical History  Diagnosis Date  . HYPOTHYROIDISM 01/26/2008  . DIABETES MELLITUS, TYPE II 06/20/2007  . HYPERTENSION 11/15/2008  . GERD 06/20/2007  . HYPERCHOLESTEROLEMIA 01/05/2010  . ASTHMA 06/20/2007  . Polycystic ovary syndrome     Past Surgical History  Procedure Date  . Laparoscopic gastric banding 05/13/2009    Dr. Wilfred Curtis [facility]    History   Social History  . Marital Status: Married    Spouse Name: N/A    Number of Children: N/A  . Years of Education: N/A   Occupational History  . Not on file.   Social History Main Topics  . Smoking status: Former Games developer  . Smokeless tobacco: Not on file  . Alcohol Use: Not on file  . Drug Use: Not on file  . Sexually Active: Not on file   Other Topics Concern  . Not on file   Social History Narrative  . No narrative on file    Current Outpatient Prescriptions on File Prior to Visit  Medication Sig Dispense Refill  . ALPRAZolam (XANAX) 0.25 MG tablet Take 0.25 mg by mouth daily as needed.        Marland Kitchen levothyroxine (SYNTHROID, LEVOTHROID) 100 MCG tablet Take 1 tablet (100 mcg total) by mouth daily.  30 tablet  0  . omeprazole (PRILOSEC) 20 MG capsule Take 20 mg by mouth daily.        Marland Kitchen PARoxetine (PAXIL-CR) 25 MG 24 hr tablet Take 25 mg by mouth every morning.          Allergies  Allergen Reactions  . Sulfonamide Derivatives     Family History  Problem Relation Age of Onset  . Hypertension Mother   . Hypertension Father     BP 108/76  Pulse 69  Temp(Src) 98.1 F (36.7 C) (Oral)  Ht 5\' 1"  (1.549 m)  Wt 259 lb 9.6 oz (117.754 kg)  BMI 49.05 kg/m2   SpO2 96%  LMP 04/09/2011  Review of Systems She has intermittent regurgitation.    Objective:   Physical Exam Pulses: dorsalis pedis intact bilat.   Feet: no deformity.  no ulcer on the feet.  feet are of normal color and temp.  no edema Neuro: sensation is intact to touch on the feet    Lab Results  Component Value Date   WBC 8.7 01/15/2010   HGB 12.1 01/15/2010   HCT 36.9 01/15/2010   PLT 248 01/15/2010   CHOL 221* 04/22/2011   TRIG 181.0* 04/22/2011   HDL 51.90 04/22/2011   LDLDIRECT 157.7 04/22/2011   ALT 118* 05/09/2009   AST 109* 05/09/2009   NA 140 04/22/2011   K 4.0 04/22/2011   CL 105 04/22/2011   CREATININE 0.7 04/22/2011   BUN 14 04/22/2011   CO2 26 04/22/2011   TSH 1.27 04/22/2011   HGBA1C 6.4 04/22/2011   MICROALBUR 0.6 04/22/2011   Assessment & Plan:  Dyslipidemia, needs increased rx Dm, Needs increased rx, if it can be done with a regimen that avoids or minimizes hypoglycemia. Hypothyroidism, well-replaced. Weight loss, needs increased rx

## 2011-05-15 ENCOUNTER — Other Ambulatory Visit: Payer: Self-pay | Admitting: Endocrinology

## 2011-06-21 ENCOUNTER — Emergency Department (HOSPITAL_COMMUNITY)
Admission: EM | Admit: 2011-06-21 | Discharge: 2011-06-21 | Disposition: A | Discharge status: Home / Self Care | Payer: BC Managed Care – PPO | Attending: Emergency Medicine | Admitting: Emergency Medicine

## 2011-06-21 DIAGNOSIS — M545 Low back pain, unspecified: Secondary | ICD-10-CM | POA: Insufficient documentation

## 2011-06-21 DIAGNOSIS — M549 Dorsalgia, unspecified: Secondary | ICD-10-CM | POA: Insufficient documentation

## 2011-06-30 LAB — CBC
HCT: 37
Hemoglobin: 12.3
RDW: 15.8 — ABNORMAL HIGH

## 2011-06-30 LAB — BASIC METABOLIC PANEL
BUN: 14
Chloride: 102
Creatinine, Ser: 0.68
GFR calc Af Amer: >60
GFR calc non Af Amer: >60
Potassium: 3.8

## 2011-07-13 LAB — TYPE AND SCREEN
ABO/RH(D): A POS
Antibody Screen: NEGATIVE

## 2011-07-13 LAB — CBC
HCT: 37.6
Hemoglobin: 10.4 — ABNORMAL LOW
MCHC: 34.2
Platelets: 133 — ABNORMAL LOW
Platelets: 180
RDW: 16.2 — ABNORMAL HIGH
RDW: 16.3 — ABNORMAL HIGH
WBC: 8

## 2011-07-13 LAB — ABO/RH: ABO/RH(D): A POS

## 2011-08-06 ENCOUNTER — Other Ambulatory Visit: Payer: Self-pay | Admitting: *Deleted

## 2011-08-06 MED ORDER — METFORMIN HCL 500 MG PO TABS: 500.0000 mg | ORAL_TABLET | Freq: Every day | ORAL | Status: DC

## 2011-08-06 NOTE — Telephone Encounter (Signed)
i sent rx 

## 2011-08-06 NOTE — Telephone Encounter (Signed)
Pt states that she needs rx for Metformin sent to [facility] as suggested by MD at [facility].

## 2011-08-09 NOTE — Telephone Encounter (Signed)
Pt informed

## 2011-11-16 ENCOUNTER — Other Ambulatory Visit: Payer: Self-pay | Admitting: Endocrinology

## 2012-02-15 ENCOUNTER — Other Ambulatory Visit: Payer: Self-pay | Admitting: Endocrinology

## 2012-05-13 ENCOUNTER — Other Ambulatory Visit: Payer: Self-pay | Admitting: Endocrinology

## 2012-05-14 ENCOUNTER — Other Ambulatory Visit: Payer: Self-pay | Admitting: Endocrinology

## 2012-05-15 ENCOUNTER — Other Ambulatory Visit: Payer: Self-pay | Admitting: Obstetrics and Gynecology

## 2012-08-24 ENCOUNTER — Telehealth: Payer: Self-pay | Admitting: Endocrinology

## 2012-08-24 NOTE — Telephone Encounter (Signed)
Pt needs refill on levothyroxine and metformin. She has a fu appt on 09/11/2012 but she is already out of meds. [facility] on [address].

## 2012-09-11 ENCOUNTER — Encounter: Payer: Self-pay | Admitting: Endocrinology

## 2012-09-11 ENCOUNTER — Ambulatory Visit (INDEPENDENT_AMBULATORY_CARE_PROVIDER_SITE_OTHER): Payer: BC Managed Care – PPO | Admitting: Endocrinology

## 2012-09-11 VITALS — BP 120/80 | HR 81 | Temp 98.8°F | Wt 254.0 lb

## 2012-09-11 DIAGNOSIS — E119 Type 2 diabetes mellitus without complications: Secondary | ICD-10-CM

## 2012-09-11 LAB — BASIC METABOLIC PANEL
BUN: 12 mg/dL (ref 6–23)
CO2: 26 mEq/L (ref 19–32)
Calcium: 9.2 mg/dL (ref 8.4–10.5)
Creat: 0.72 mg/dL (ref 0.50–1.10)
Glucose, Bld: 109 mg/dL — ABNORMAL HIGH (ref 70–99)
Sodium: 139 mEq/L (ref 135–145)

## 2012-09-11 LAB — HEMOGLOBIN A1C
Hgb A1c MFr Bld: 6 % — ABNORMAL HIGH (ref <5.7)
Mean Plasma Glucose: 126 mg/dL — ABNORMAL HIGH (ref <117)

## 2012-09-11 NOTE — Progress Notes (Signed)
  Subjective:    Patient ID: Jordan Holloway, female    DOB: 12/28/1965, 46 y.o.   MRN: [id number]  HPI Pt returns for f/u of type 2 DM (dx'ed 2009; no known complications).  She had gastric banding in 2011.  Pt says her cbg's are well-controlled.   Past Medical History  Diagnosis Date  . HYPOTHYROIDISM 01/26/2008  . DIABETES MELLITUS, TYPE II 06/20/2007  . HYPERTENSION 11/15/2008  . GERD 06/20/2007  . HYPERCHOLESTEROLEMIA 01/05/2010  . ASTHMA 06/20/2007  . Polycystic ovary syndrome     Past Surgical History  Procedure Date  . Laparoscopic gastric banding 05/13/2009    Dr. Wilfred [facility]    History   Social History  . Marital Status: Married    Spouse Name: N/A    Number of Children: N/A  . Years of Education: N/A   Occupational History  . Not on file.   Social History Main Topics  . Smoking status: Former Games developer  . Smokeless tobacco: Not on file  . Alcohol Use: Not on file  . Drug Use: Not on file  . Sexually Active: Not on file   Other Topics Concern  . Not on file   Social History Narrative  . No narrative on file    Current Outpatient Prescriptions on File Prior to Visit  Medication Sig Dispense Refill  . ALPRAZolam (XANAX) 0.25 MG tablet Take 0.25 mg by mouth daily as needed.        Marland Kitchen levothyroxine (SYNTHROID, LEVOTHROID) 100 MCG tablet TAKE ONE TABLET BY MOUTH ONE TIME DAILY  30 tablet  2  . metFORMIN (GLUCOPHAGE) 500 MG tablet TAKE 1 TABLET BY MOUTH DAILY WITH BREAKFAST.  30 tablet  2  . omeprazole (PRILOSEC) 20 MG capsule Take 20 mg by mouth daily.        Marland Kitchen PARoxetine (PAXIL-CR) 25 MG 24 hr tablet Take 25 mg by mouth every morning.        . SUMAtriptan (IMITREX) 100 MG tablet 1 tablet as needed for migraines        Allergies  Allergen Reactions  . Sulfonamide Derivatives     Family History  Problem Relation Age of Onset  . Hypertension Mother   . Hypertension Father     BP 120/80  Pulse 81  Temp 98.8 F (37.1 C) (Oral)  Wt 254  lb (115.214 kg)  SpO2 97%  Review of Systems Her weight has leveled off.        Objective:   Physical Exam VITAL SIGNS:  See vs page GENERAL: no distress Pulses: dorsalis pedis intact bilat.   Feet: no deformity.  no ulcer on the feet.  feet are of normal color and temp.  no edema Neuro: sensation is intact to touch on the feet.     Lab Results  Component Value Date   HGBA1C 6.0* 09/11/2012      Assessment & Plan:  DM is well-controlled

## 2012-09-11 NOTE — Patient Instructions (Addendum)
blood tests are being requested for you today.  We'll contact you with results. Please return in 1 year. Please continue seeing the weight-loss surgery specialist.

## 2012-09-12 ENCOUNTER — Telehealth: Payer: Self-pay | Admitting: *Deleted

## 2012-09-12 LAB — MICROALBUMIN / CREATININE URINE RATIO: Microalb, Ur: 0.66 mg/dL (ref 0.00–1.89)

## 2012-09-12 NOTE — Telephone Encounter (Signed)
Message copied by Elnora Morrison on Tue Sep 12, 2012  1:13 PM ------      Message from: Romero Belling      Created: Tue Sep 12, 2012 12:18 PM       please call patient:      Normal      Please continue the same medication      i'll see you next time.

## 2012-09-12 NOTE — Telephone Encounter (Signed)
Patient notified of normal lab results good. Continue same medications. Follow up with Dr. Everardo All.

## 2012-10-02 ENCOUNTER — Telehealth: Payer: Self-pay | Admitting: Endocrinology

## 2012-10-02 MED ORDER — LEVOTHYROXINE SODIUM 100 MCG PO TABS: 100.0000 ug | ORAL_TABLET | Freq: Every day | ORAL | Status: DC

## 2012-10-02 MED ORDER — METFORMIN HCL 500 MG PO TABS: 500.0000 mg | ORAL_TABLET | Freq: Every day | ORAL | Status: DC

## 2012-10-02 NOTE — Telephone Encounter (Signed)
The patient is requesting refill of Levothyroxine and Metformin.  The patient states she is out of all of her medications and has gone to pick these rx's up x 3 at the [facility] on [address] and it was not sent in per patient.  Please call patient at [phone].

## 2012-10-02 NOTE — Telephone Encounter (Signed)
rx'ssent to pharmacy pt was last seen December 2013

## 2012-12-25 ENCOUNTER — Other Ambulatory Visit: Payer: Self-pay

## 2012-12-25 MED ORDER — METFORMIN HCL 500 MG PO TABS: 500.0000 mg | ORAL_TABLET | Freq: Every day | ORAL | Status: DC

## 2012-12-25 MED ORDER — LEVOTHYROXINE SODIUM 100 MCG PO TABS: 100.0000 ug | ORAL_TABLET | Freq: Every day | ORAL | Status: DC

## 2013-02-16 ENCOUNTER — Encounter (INDEPENDENT_AMBULATORY_CARE_PROVIDER_SITE_OTHER): Payer: Self-pay | Admitting: Surgery

## 2013-02-23 ENCOUNTER — Other Ambulatory Visit: Payer: Self-pay | Admitting: Endocrinology

## 2013-03-19 ENCOUNTER — Other Ambulatory Visit: Payer: Self-pay | Admitting: Endocrinology

## 2013-03-26 ENCOUNTER — Other Ambulatory Visit: Payer: Self-pay | Admitting: Endocrinology

## 2013-04-21 ENCOUNTER — Other Ambulatory Visit: Payer: Self-pay | Admitting: Endocrinology

## 2013-05-29 ENCOUNTER — Other Ambulatory Visit: Payer: Self-pay | Admitting: Endocrinology

## 2013-07-04 ENCOUNTER — Other Ambulatory Visit: Payer: Self-pay | Admitting: Endocrinology

## 2013-07-07 ENCOUNTER — Other Ambulatory Visit: Payer: Self-pay | Admitting: Endocrinology

## 2013-07-09 ENCOUNTER — Other Ambulatory Visit: Payer: Self-pay | Admitting: *Deleted

## 2013-07-09 ENCOUNTER — Other Ambulatory Visit: Payer: Self-pay

## 2013-07-09 MED ORDER — LEVOTHYROXINE SODIUM 100 MCG PO TABS: 100.0000 ug | ORAL_TABLET | Freq: Every day | ORAL | Status: DC

## 2013-07-09 MED ORDER — METFORMIN HCL 500 MG PO TABS: 500.0000 mg | ORAL_TABLET | Freq: Every day | ORAL | Status: DC

## 2013-07-25 ENCOUNTER — Ambulatory Visit (INDEPENDENT_AMBULATORY_CARE_PROVIDER_SITE_OTHER): Payer: BC Managed Care – PPO | Admitting: Endocrinology

## 2013-07-25 ENCOUNTER — Encounter: Payer: Self-pay | Admitting: Endocrinology

## 2013-07-25 VITALS — BP 130/70 | HR 80 | Ht 62.0 in | Wt 243.0 lb

## 2013-07-25 DIAGNOSIS — Z23 Encounter for immunization: Secondary | ICD-10-CM

## 2013-07-25 DIAGNOSIS — E119 Type 2 diabetes mellitus without complications: Secondary | ICD-10-CM

## 2013-07-25 NOTE — Patient Instructions (Signed)
check your blood sugar once a day.  vary the time of day when you check, between before the 3 meals, and at bedtime.  also check if you have symptoms of your blood sugar being too high or too low.  please keep a record of the readings and bring it to your next appointment here.  You can write it on any piece of paper.  please call us sooner if your blood sugar goes below 70, or if you have a lot of readings over 200. Please go back to the weight-loss surgery office, to see what else can be done.  Please come back for a follow-up appointment in 6 months.

## 2013-07-25 NOTE — Progress Notes (Signed)
  Subjective:    Patient ID: Jordan Holloway, female    DOB: 10-10-1965, 47 y.o.   MRN: [id number]  HPI Pt returns for f/u of type 2 DM (dx'ed 2009, but she had GDM in 2004; she has mild if any neuropathy of the lower extremities; no known associated complications; she had gastric banding in 2011; she has lost 35 lbs since then, but she has leveled off.  She does not check cbg's.   Past Medical History  Diagnosis Date  . HYPOTHYROIDISM 01/26/2008  . DIABETES MELLITUS, TYPE II 06/20/2007  . HYPERTENSION 11/15/2008  . GERD 06/20/2007  . HYPERCHOLESTEROLEMIA 01/05/2010  . ASTHMA 06/20/2007  . Polycystic ovary syndrome     Past Surgical History  Procedure Laterality Date  . Laparoscopic gastric banding  05/13/2009    Dr. Wilfred [facility]    History   Social History  . Marital Status: Married    Spouse Name: N/A    Number of Children: N/A  . Years of Education: N/A   Occupational History  . Not on file.   Social History Main Topics  . Smoking status: Former Games developer  . Smokeless tobacco: Not on file  . Alcohol Use: Not on file  . Drug Use: Not on file  . Sexual Activity: Not on file   Other Topics Concern  . Not on file   Social History Narrative  . No narrative on file    Current Outpatient Prescriptions on File Prior to Visit  Medication Sig Dispense Refill  . ALPRAZolam (XANAX) 0.25 MG tablet Take 0.25 mg by mouth daily as needed.        Marland Kitchen levothyroxine (SYNTHROID, LEVOTHROID) 100 MCG tablet Take 1 tablet (100 mcg total) by mouth daily before breakfast.  30 tablet  1  . metFORMIN (GLUCOPHAGE) 500 MG tablet Take 1 tablet (500 mg total) by mouth daily with breakfast.  30 tablet  1  . omeprazole (PRILOSEC) 20 MG capsule Take 20 mg by mouth daily.        Marland Kitchen PARoxetine (PAXIL-CR) 25 MG 24 hr tablet Take 25 mg by mouth every morning.        . SUMAtriptan (IMITREX) 100 MG tablet 1 tablet as needed for migraines       No current facility-administered medications on  file prior to visit.    Allergies  Allergen Reactions  . Sulfonamide Derivatives     Family History  Problem Relation Age of Onset  . Hypertension Mother   . Hypertension Father    BP 130/70  Pulse 80  Ht 5\' 2"  (1.575 m)  Wt 243 lb (110.224 kg)  BMI 44.43 kg/m2  SpO2 97%  Review of Systems denies hypoglycemia and edema    Objective:   Physical Exam VITAL SIGNS:  See vs page GENERAL: no distress  Lab Results  Component Value Date   HGBA1C 6.1 07/25/2013      Assessment & Plan:  Type 2 DM: well-controlled Bariatric surgery status: this has helped, but weight result is not yet optimal.

## 2013-07-26 LAB — BASIC METABOLIC PANEL
BUN: 15 mg/dL (ref 6–23)
Creatinine, Ser: 0.7 mg/dL (ref 0.4–1.2)
GFR: 89.41 mL/min (ref >60.00)
Glucose, Bld: 90 mg/dL (ref 70–99)
Potassium: 4 mEq/L (ref 3.5–5.1)

## 2013-07-26 LAB — TSH: TSH: 3.45 u[IU]/mL (ref 0.35–5.50)

## 2013-11-12 ENCOUNTER — Other Ambulatory Visit: Payer: Self-pay | Admitting: Endocrinology

## 2014-02-06 ENCOUNTER — Other Ambulatory Visit: Payer: Self-pay | Admitting: Endocrinology

## 2014-03-14 ENCOUNTER — Other Ambulatory Visit: Payer: Self-pay

## 2014-03-14 MED ORDER — METFORMIN HCL 500 MG PO TABS: ORAL_TABLET | ORAL | Status: DC

## 2014-03-14 MED ORDER — LEVOTHYROXINE SODIUM 100 MCG PO TABS: ORAL_TABLET | ORAL | Status: DC

## 2014-05-01 ENCOUNTER — Telehealth: Payer: Self-pay

## 2014-05-01 NOTE — Telephone Encounter (Signed)
Diabetic Bundle. Lvom for pt to call back and schedule appointment Dr. Loanne Drilling.

## 2014-06-13 ENCOUNTER — Other Ambulatory Visit: Payer: Self-pay | Admitting: Endocrinology

## 2014-06-25 ENCOUNTER — Ambulatory Visit: Payer: BC Managed Care – PPO | Admitting: Endocrinology

## 2014-07-01 ENCOUNTER — Ambulatory Visit: Payer: BC Managed Care – PPO | Admitting: Endocrinology

## 2014-07-10 ENCOUNTER — Ambulatory Visit (INDEPENDENT_AMBULATORY_CARE_PROVIDER_SITE_OTHER): Payer: BC Managed Care – PPO | Admitting: Endocrinology

## 2014-07-10 ENCOUNTER — Encounter: Payer: Self-pay | Admitting: Endocrinology

## 2014-07-10 VITALS — BP 122/86 | HR 77 | Temp 98.0°F | Ht 62.0 in | Wt 246.0 lb

## 2014-07-10 DIAGNOSIS — E119 Type 2 diabetes mellitus without complications: Secondary | ICD-10-CM

## 2014-07-10 LAB — MICROALBUMIN / CREATININE URINE RATIO
Creatinine,U: 286.2 mg/dL
Microalb Creat Ratio: 0.6 mg/g (ref 0.0–30.0)
Microalb, Ur: 1.8 mg/dL (ref 0.0–1.9)

## 2014-07-10 LAB — HEMOGLOBIN A1C: HEMOGLOBIN A1C: 6.1 % (ref 4.6–6.5)

## 2014-07-10 NOTE — Patient Instructions (Addendum)
check your blood sugar once a week.  vary the time of day when you check, between before the 3 meals, and at bedtime.  also check if you have symptoms of your blood sugar being too high or too low.  please keep a record of the readings and bring it to your next appointment here.  You can write it on any piece of paper.  please call us sooner if your blood sugar goes below 70, or if you have a lot of readings over 200. Please go back to the weight-loss surgery office, to see what else can be done.  Please come back for a follow-up appointment in 6 months.   blood and urine tests are being requested for you today.  We'll contact you with results.

## 2014-07-10 NOTE — Progress Notes (Signed)
Subjective:    Patient ID: Jordan Holloway, female    DOB: 12/25/1965, 48 y.o.   MRN: [id number]  HPI Pt returns for f/u of diabetes mellitus: DM type: 2 Dx'ed: 4098 Complications: none Therapy: insulin since GDM: 2004 DKA: never Severe hypoglycemia: never Pancreatitis: never Other: she had gastric banding in 2011; she has lost 35 lbs since then, but she has leveled off. Interval history:  no cbg record, but states cbg's are well-controlled.  pt states she feels well in general.  She tolerates the metformin well.   Past Medical History  Diagnosis Date  . HYPOTHYROIDISM 01/26/2008  . DIABETES MELLITUS, TYPE II 06/20/2007  . HYPERTENSION 11/15/2008  . GERD 06/20/2007  . HYPERCHOLESTEROLEMIA 01/05/2010  . ASTHMA 06/20/2007  . Polycystic ovary syndrome     Past Surgical History  Procedure Laterality Date  . Laparoscopic gastric banding  05/13/2009    Dr. [facility]    History   Social History  . Marital Status: Married    Spouse Name: N/A    Number of Children: N/A  . Years of Education: N/A   Occupational History  . Not on file.   Social History Main Topics  . Smoking status: Former Research scientist (life sciences)  . Smokeless tobacco: Not on file  . Alcohol Use: Not on file  . Drug Use: Not on file  . Sexual Activity: Not on file   Other Topics Concern  . Not on file   Social History Narrative  . No narrative on file    Current Outpatient Prescriptions on File Prior to Visit  Medication Sig Dispense Refill  . ALPRAZolam (XANAX) 0.25 MG tablet Take 0.25 mg by mouth daily as needed.        Marland Kitchen levothyroxine (SYNTHROID, LEVOTHROID) 100 MCG tablet TAKE ONE TABLET BY MOUTH EVERY DAY BEFORE BREAKFAST.  30 tablet  0  . metFORMIN (GLUCOPHAGE) 500 MG tablet TAKE 1 TABLET BY MOUTH ONCE DAILY WITH BREAKFAST  30 tablet  0  . omeprazole (PRILOSEC) 20 MG capsule Take 20 mg by mouth daily.        Marland Kitchen PARoxetine (PAXIL-CR) 25 MG 24 hr tablet Take 30 mg by mouth every morning.         . SUMAtriptan (IMITREX) 100 MG tablet 1 tablet as needed for migraines       No current facility-administered medications on file prior to visit.    Allergies  Allergen Reactions  . Sulfonamide Derivatives     Family History  Problem Relation Age of Onset  . Hypertension Mother   . Hypertension Father     BP 122/86  Pulse 77  Temp(Src) 98 F (36.7 C) (Oral)  Ht 5\' 2"  (1.575 m)  Wt 246 lb (111.585 kg)  BMI 44.98 kg/m2  SpO2 98%  Review of Systems Denies weight change.   She seldom has n/v    Objective:   Physical Exam VITAL SIGNS:  See vs page GENERAL: no distress Pulses: dorsalis pedis intact bilat.   Feet: no deformity.  no edema Skin:  no ulcer on the feet.  normal color and temp. Neuro: sensation is intact to touch on the feet.    Lab Results  Component Value Date   HGBA1C 6.1 07/10/2014      Assessment & Plan:  DM: well-controlled Morbid obesity, improved, but not at goal.    Patient is advised the following: Patient Instructions  check your blood sugar once a week.  vary the time of day when you  check, between before the 3 meals, and at bedtime.  also check if you have symptoms of your blood sugar being too high or too low.  please keep a record of the readings and bring it to your next appointment here.  You can write it on any piece of paper.  please call us sooner if your blood sugar goes below 70, or if you have a lot of readings over 200. Please go back to the weight-loss surgery office, to see what else can be done.  Please come back for a follow-up appointment in 6 months.   blood and urine tests are being requested for you today.  We'll contact you with results.

## 2014-07-12 ENCOUNTER — Other Ambulatory Visit: Payer: Self-pay | Admitting: Endocrinology

## 2014-08-09 ENCOUNTER — Other Ambulatory Visit: Payer: Self-pay | Admitting: Endocrinology

## 2014-09-10 ENCOUNTER — Other Ambulatory Visit: Payer: Self-pay | Admitting: Obstetrics and Gynecology

## 2014-09-11 LAB — CYTOLOGY - PAP

## 2014-09-14 ENCOUNTER — Other Ambulatory Visit: Payer: Self-pay | Admitting: Endocrinology

## 2014-12-03 ENCOUNTER — Other Ambulatory Visit: Payer: Self-pay | Admitting: Endocrinology

## 2014-12-10 ENCOUNTER — Other Ambulatory Visit: Payer: Self-pay

## 2014-12-10 MED ORDER — OMEPRAZOLE 20 MG PO CPDR: 20.0000 mg | DELAYED_RELEASE_CAPSULE | Freq: Every day | ORAL | Status: DC

## 2014-12-12 LAB — HM DIABETES EYE EXAM

## 2014-12-27 ENCOUNTER — Other Ambulatory Visit: Payer: Self-pay | Admitting: Endocrinology

## 2015-01-20 ENCOUNTER — Other Ambulatory Visit: Payer: Self-pay | Admitting: Endocrinology

## 2015-01-22 ENCOUNTER — Encounter: Payer: Self-pay | Admitting: Endocrinology

## 2015-02-05 ENCOUNTER — Other Ambulatory Visit: Payer: Self-pay | Admitting: Endocrinology

## 2015-02-16 ENCOUNTER — Other Ambulatory Visit: Payer: Self-pay | Admitting: Endocrinology

## 2015-02-21 ENCOUNTER — Telehealth: Payer: Self-pay

## 2015-02-21 NOTE — Telephone Encounter (Signed)
please call patient: i only see pt for DM (and ov for that is due)

## 2015-02-21 NOTE — Telephone Encounter (Signed)
I contacted the patient and advised of note below. Patient scheduled for 04/01/2015.

## 2015-02-21 NOTE — Telephone Encounter (Signed)
Pt is requesting a refill on her Xanax. Medication is listed under a historical provider. Thanks!

## 2015-03-03 ENCOUNTER — Other Ambulatory Visit: Payer: Self-pay | Admitting: Endocrinology

## 2015-04-01 ENCOUNTER — Ambulatory Visit: Payer: Self-pay | Admitting: Endocrinology

## 2015-04-01 ENCOUNTER — Telehealth: Payer: Self-pay | Admitting: Endocrinology

## 2015-04-01 DIAGNOSIS — Z0289 Encounter for other administrative examinations: Secondary | ICD-10-CM

## 2015-04-01 NOTE — Telephone Encounter (Signed)
Follow up advised. Contact patient and schedule visit in 2 months. 

## 2015-04-01 NOTE — Telephone Encounter (Signed)
Pt has rescheduled her appointment for 04/21/2015 at 315 pm.

## 2015-04-01 NOTE — Telephone Encounter (Signed)
Patient no showed today's appt. Please advise on how to follow up. °A. No follow up necessary. °B. Follow up urgent. Contact patient immediately. °C. Follow up necessary. Contact patient and schedule visit in ___ days. °D. Follow up advised. Contact patient and schedule visit in ____weeks. ° °

## 2015-04-04 ENCOUNTER — Other Ambulatory Visit: Payer: Self-pay | Admitting: Endocrinology

## 2015-04-06 ENCOUNTER — Other Ambulatory Visit: Payer: Self-pay | Admitting: Endocrinology

## 2015-04-22 ENCOUNTER — Encounter: Payer: Self-pay | Admitting: Endocrinology

## 2015-04-22 ENCOUNTER — Ambulatory Visit (INDEPENDENT_AMBULATORY_CARE_PROVIDER_SITE_OTHER): Payer: BC Managed Care – PPO | Admitting: Endocrinology

## 2015-04-22 VITALS — BP 127/88 | HR 72 | Temp 98.1°F | Ht 62.0 in | Wt 231.0 lb

## 2015-04-22 DIAGNOSIS — I10 Essential (primary) hypertension: Secondary | ICD-10-CM

## 2015-04-22 DIAGNOSIS — E119 Type 2 diabetes mellitus without complications: Secondary | ICD-10-CM | POA: Diagnosis not present

## 2015-04-22 DIAGNOSIS — E7800 Pure hypercholesterolemia, unspecified: Secondary | ICD-10-CM

## 2015-04-22 DIAGNOSIS — E039 Hypothyroidism, unspecified: Secondary | ICD-10-CM

## 2015-04-22 LAB — MICROALBUMIN / CREATININE URINE RATIO
Creatinine,U: 114.3 mg/dL
Microalb Creat Ratio: 0.6 mg/g (ref 0.0–30.0)

## 2015-04-22 LAB — BASIC METABOLIC PANEL
BUN: 13 mg/dL (ref 6–23)
CALCIUM: 9.4 mg/dL (ref 8.4–10.5)
CO2: 26 meq/L (ref 19–32)
Chloride: 102 mEq/L (ref 96–112)
Creatinine, Ser: 0.82 mg/dL (ref 0.40–1.20)
GFR: 78.84 mL/min (ref >60.00)
Glucose, Bld: 112 mg/dL — ABNORMAL HIGH (ref 70–99)
Potassium: 4 mEq/L (ref 3.5–5.1)
Sodium: 137 mEq/L (ref 135–145)

## 2015-04-22 LAB — LIPID PANEL
CHOL/HDL RATIO: 4
Cholesterol: 202 mg/dL — ABNORMAL HIGH (ref 0–200)
HDL: 57.1 mg/dL (ref >39.00)
LDL CALC: 118 mg/dL — AB (ref 0–99)
NONHDL: 144.9
TRIGLYCERIDES: 137 mg/dL (ref 0.0–149.0)
VLDL: 27.4 mg/dL (ref 0.0–40.0)

## 2015-04-22 LAB — POCT GLYCOSYLATED HEMOGLOBIN (HGB A1C): Hemoglobin A1C: 5.8

## 2015-04-22 LAB — TSH: TSH: 1.02 u[IU]/mL (ref 0.35–4.50)

## 2015-04-22 NOTE — Patient Instructions (Addendum)
Please continue the same metformin. Please consider having weight loss surgery again.  Please let me know if there is anything else I can do to help.   At our office, we are fortunate to have two specialists who are happy to help you:   Leonia Reader, RN, CDE, is a diabetes educator and pump trainer.  She is here on Monday mornings, and all day Tuesday and Wednesday.  She is can help you with low blood sugar avoidance and treatment, injecting insulin, sick day management, and others.   Antonieta Iba, RD is our dietician.  She is here all day Thursday and Friday.  She can advise you about a healthy diet.  She can also help you about a variety of special diabetes situations, such as shift work, Actor, gluten-free, diet for kidney patients, traveling with diabetes, and help for those who need to gain weight.   Please come back for a follow-up appointment in 6 months.   blood tests are requested for you today.  We'll let you know about the results.

## 2015-04-22 NOTE — Progress Notes (Signed)
Subjective:    Patient ID: Jordan Holloway, female    DOB: 1966-02-26, 49 y.o.   MRN: [id number]  HPI Pt returns for f/u of diabetes mellitus: DM type: 2 Dx'ed: 1829 Complications: none Therapy: metformin GDM: 2004 DKA: never. Severe hypoglycemia: never Pancreatitis: never. Other: she had gastric banding in 2011; she has lost 40 lbs since then.  She has never been on insulin, except during a pregnancy.   Interval history: no cbg record, but states cbg's are well-controlled.  pt states she feels well in general.  She tolerates the metformin well, but she has intermittent regurgitation.   Past Medical History  Diagnosis Date  . HYPOTHYROIDISM 01/26/2008  . DIABETES MELLITUS, TYPE II 06/20/2007  . HYPERTENSION 11/15/2008  . GERD 06/20/2007  . HYPERCHOLESTEROLEMIA 01/05/2010  . ASTHMA 06/20/2007  . Polycystic ovary syndrome     Past Surgical History  Procedure Laterality Date  . Laparoscopic gastric banding  05/13/2009    Dr. [facility]    History   Social History  . Marital Status: Married    Spouse Name: N/A  . Number of Children: N/A  . Years of Education: N/A   Occupational History  . Not on file.   Social History Main Topics  . Smoking status: Former Research scientist (life sciences)  . Smokeless tobacco: Not on file  . Alcohol Use: Not on file  . Drug Use: Not on file  . Sexual Activity: Not on file   Other Topics Concern  . Not on file   Social History Narrative    Current Outpatient Prescriptions on File Prior to Visit  Medication Sig Dispense Refill  . ALPRAZolam (XANAX) 0.25 MG tablet Take 0.25 mg by mouth daily as needed.      Marland Kitchen levothyroxine (SYNTHROID, LEVOTHROID) 100 MCG tablet TAKE ONE TABLET BY MOUTH EVERY DAY BEFORE BREAKFAST. 30 tablet 1  . metFORMIN (GLUCOPHAGE) 500 MG tablet TAKE 1 TABLET BY MOUTH ONCE DAILY WITH BREAKFAST 30 tablet 1  . omeprazole (PRILOSEC) 20 MG capsule Take 1 capsule (20 mg total) by mouth daily. 30 capsule 1  . PARoxetine  (PAXIL-CR) 25 MG 24 hr tablet Take 30 mg by mouth every morning.     . SUMAtriptan (IMITREX) 100 MG tablet 1 tablet as needed for migraines     No current facility-administered medications on file prior to visit.    Allergies  Allergen Reactions  . Sulfonamide Derivatives     Family History  Problem Relation Age of Onset  . Hypertension Mother   . Hypertension Father     BP 127/88 mmHg  Pulse 72  Temp(Src) 98.1 F (36.7 C) (Oral)  Ht 5\' 2"  (1.575 m)  Wt 231 lb (104.781 kg)  BMI 42.24 kg/m2  SpO2 96%    Review of Systems She has lost a few more lbs.      Objective:   Physical Exam VITAL SIGNS:  See vs page GENERAL: no distress Pulses: dorsalis pedis intact bilat.   MSK: no deformity of the feet CV: no leg edema Skin:  no ulcer on the feet.  normal color and temp on the feet. Neuro: sensation is intact to touch on the feet   Lab Results  Component Value Date   CHOL 202* 04/22/2015   HDL 57.10 04/22/2015   LDLCALC 118* 04/22/2015   LDLDIRECT 157.7 04/22/2011   TRIG 137.0 04/22/2015   CHOLHDL 4 04/22/2015   A1c=5.8%    Assessment & Plan:  Morbid obesity: improved, but she needs further  improvement: we discussed options.   DM: well-controlled Dyslipidemia: new to me: i advised medication.   Patient is advised the following: Patient Instructions  Please continue the same metformin. Please consider having weight loss surgery again.  Please let me know if there is anything else I can do to help.   At our office, we are fortunate to have two specialists who are happy to help you:   Leonia Reader, RN, CDE, is a diabetes educator and pump trainer.  She is here on Monday mornings, and all day Tuesday and Wednesday.  She is can help you with low blood sugar avoidance and treatment, injecting insulin, sick day management, and others.   Antonieta Iba, RD is our dietician.  She is here all day Thursday and Friday.  She can advise you about a healthy diet.  She can also  help you about a variety of special diabetes situations, such as shift work, Actor, gluten-free, diet for kidney patients, traveling with diabetes, and help for those who need to gain weight.   Please come back for a follow-up appointment in 6 months.   blood tests are requested for you today.  We'll let you know about the results.

## 2015-04-23 ENCOUNTER — Telehealth: Payer: Self-pay | Admitting: Endocrinology

## 2015-04-23 NOTE — Telephone Encounter (Signed)
Returning your call would like to discuss the lab results and possible cholesterol meds

## 2015-04-24 MED ORDER — ATORVASTATIN CALCIUM 10 MG PO TABS: 10.0000 mg | ORAL_TABLET | Freq: Every day | ORAL | Status: DC

## 2015-04-24 NOTE — Telephone Encounter (Signed)
Ok, i have sent a prescription to your pharmacy  

## 2015-04-24 NOTE — Telephone Encounter (Signed)
Pt notified rx has been sent

## 2015-04-24 NOTE — Addendum Note (Signed)
Addended by: Renato Shin on: 04/24/2015 11:46 AM   Modules accepted: Orders

## 2015-04-24 NOTE — Telephone Encounter (Signed)
I contacted the pt. She stated she is ok with starting the cholesterol medication.

## 2015-06-13 ENCOUNTER — Other Ambulatory Visit: Payer: Self-pay | Admitting: Endocrinology

## 2015-06-17 ENCOUNTER — Other Ambulatory Visit: Payer: Self-pay | Admitting: Endocrinology

## 2015-07-06 ENCOUNTER — Other Ambulatory Visit: Payer: Self-pay | Admitting: Endocrinology

## 2015-08-11 ENCOUNTER — Other Ambulatory Visit: Payer: Self-pay | Admitting: Endocrinology

## 2015-09-29 ENCOUNTER — Other Ambulatory Visit: Payer: Self-pay | Admitting: Endocrinology

## 2015-10-10 ENCOUNTER — Other Ambulatory Visit: Payer: Self-pay | Admitting: Endocrinology

## 2015-10-29 ENCOUNTER — Ambulatory Visit (INDEPENDENT_AMBULATORY_CARE_PROVIDER_SITE_OTHER): Payer: BC Managed Care – PPO | Admitting: Endocrinology

## 2015-10-29 ENCOUNTER — Encounter: Payer: Self-pay | Admitting: Endocrinology

## 2015-10-29 VITALS — BP 137/88 | HR 78 | Temp 98.0°F | Ht 62.0 in | Wt 245.0 lb

## 2015-10-29 DIAGNOSIS — E119 Type 2 diabetes mellitus without complications: Secondary | ICD-10-CM

## 2015-10-29 LAB — POCT GLYCOSYLATED HEMOGLOBIN (HGB A1C): Hemoglobin A1C: 5.8

## 2015-10-29 MED ORDER — DULAGLUTIDE 0.75 MG/0.5ML ~~LOC~~ SOAJ: 0.7500 mg | SUBCUTANEOUS | Status: DC

## 2015-10-29 NOTE — Progress Notes (Signed)
Subjective:    Patient ID: Jordan Holloway, female    DOB: [birth year], 50 y.o.   MRN: [id number]  HPI Pt returns for f/u of diabetes mellitus: DM type: 2 Dx'ed: 123XX123 Complications: none Therapy: metformin GDM: 2004 DKA: never. Severe hypoglycemia: never Pancreatitis: never. Other: she had gastric banding in 2011; She has never been on insulin, except during a pregnancy.   Interval history: no cbg record, but states cbg's are well-controlled.  pt states she feels well in general, except for weight regain. She says she cannot afford to go for bariatric f/u.   Past Medical History  Diagnosis Date  . HYPOTHYROIDISM 01/26/2008  . DIABETES MELLITUS, TYPE II 06/20/2007  . HYPERTENSION 11/15/2008  . GERD 06/20/2007  . HYPERCHOLESTEROLEMIA 01/05/2010  . ASTHMA 06/20/2007  . Polycystic ovary syndrome     Past Surgical History  Procedure Laterality Date  . Laparoscopic gastric banding  05/13/2009    Dr. [facility]    Social History   Social History  . Marital Status: Married    Spouse Name: N/A  . Number of Children: N/A  . Years of Education: N/A   Occupational History  . Not on file.   Social History Main Topics  . Smoking status: Former Research scientist (life sciences)  . Smokeless tobacco: Not on file  . Alcohol Use: Not on file  . Drug Use: Not on file  . Sexual Activity: Not on file   Other Topics Concern  . Not on file   Social History Narrative    Current Outpatient Prescriptions on File Prior to Visit  Medication Sig Dispense Refill  . ALPRAZolam (XANAX) 0.25 MG tablet Take 0.25 mg by mouth daily as needed.      Marland Kitchen atorvastatin (LIPITOR) 10 MG tablet Take 1 tablet (10 mg total) by mouth daily. 90 tablet 3  . levothyroxine (SYNTHROID, LEVOTHROID) 100 MCG tablet TAKE ONE TABLET BY MOUTH EVERY DAY BEFORE BREAKFAST. 30 tablet 1  . metFORMIN (GLUCOPHAGE) 500 MG tablet TAKE 1 TABLET BY MOUTH ONCE DAILY WITH BREAKFAST 30 tablet 1  . omeprazole (PRILOSEC) 20 MG capsule Take  1 capsule (20 mg total) by mouth daily. 30 capsule 1  . PARoxetine (PAXIL-CR) 25 MG 24 hr tablet Take 30 mg by mouth every morning.     . SUMAtriptan (IMITREX) 100 MG tablet 1 tablet as needed for migraines     No current facility-administered medications on file prior to visit.    Allergies  Allergen Reactions  . Sulfonamide Derivatives     Family History  Problem Relation Age of Onset  . Hypertension Mother   . Hypertension Father     BP 137/88 mmHg  Pulse 78  Temp(Src) 98 F (36.7 C) (Oral)  Ht 5\' 2"  (1.575 m)  Wt 245 lb (111.131 kg)  BMI 44.80 kg/m2  SpO2 95%   Review of Systems She denies hypoglycemia    Objective:   Physical Exam VITAL SIGNS:  See vs page GENERAL: no distress Pulses: dorsalis pedis intact bilat.   MSK: no deformity of the feet CV: no leg edema Skin:  no ulcer on the feet.  normal color and temp on the feet. Neuro: sensation is intact to touch on the feet.     A1c=5.8%    Assessment & Plan:  DM: well-controlled Obesity: worse.  Patient is advised the following: Patient Instructions  Please continue the same metformin. i have sent a prescription to your pharmacy, to add "trulicuty." if it goes well for a  few weeks, call and we can increase to the larger strength.   Please come back for a follow-up appointment in 6 months.

## 2015-10-29 NOTE — Patient Instructions (Signed)
Please continue the same metformin. i have sent a prescription to your pharmacy, to add "trulicuty." if it goes well for a few weeks, call and we can increase to the larger strength.   Please come back for a follow-up appointment in 6 months.

## 2015-11-05 ENCOUNTER — Other Ambulatory Visit: Payer: Self-pay | Admitting: Endocrinology

## 2015-12-02 ENCOUNTER — Other Ambulatory Visit: Payer: Self-pay | Admitting: Endocrinology

## 2016-01-03 ENCOUNTER — Other Ambulatory Visit: Payer: Self-pay | Admitting: Endocrinology

## 2016-03-18 ENCOUNTER — Other Ambulatory Visit: Payer: Self-pay | Admitting: Endocrinology

## 2016-04-27 ENCOUNTER — Encounter: Payer: Self-pay | Admitting: Endocrinology

## 2016-04-27 ENCOUNTER — Ambulatory Visit (INDEPENDENT_AMBULATORY_CARE_PROVIDER_SITE_OTHER): Payer: BC Managed Care – PPO | Admitting: Endocrinology

## 2016-04-27 VITALS — BP 147/108 | HR 74 | Temp 98.9°F | Ht 62.0 in | Wt 249.6 lb

## 2016-04-27 DIAGNOSIS — E119 Type 2 diabetes mellitus without complications: Secondary | ICD-10-CM | POA: Diagnosis not present

## 2016-04-27 LAB — POCT GLYCOSYLATED HEMOGLOBIN (HGB A1C): Hemoglobin A1C: 5.6

## 2016-04-27 LAB — LIPID PANEL
Cholesterol: 166 mg/dL (ref 0–200)
HDL: 51.6 mg/dL (ref >39.00)
LDL Cholesterol: 85 mg/dL (ref 0–99)
NONHDL: 114.83
TRIGLYCERIDES: 150 mg/dL — AB (ref 0.0–149.0)
Total CHOL/HDL Ratio: 3
VLDL: 30 mg/dL (ref 0.0–40.0)

## 2016-04-27 LAB — BASIC METABOLIC PANEL
BUN: 12 mg/dL (ref 6–23)
CHLORIDE: 104 meq/L (ref 96–112)
CO2: 28 mEq/L (ref 19–32)
Calcium: 9.2 mg/dL (ref 8.4–10.5)
Creatinine, Ser: 0.83 mg/dL (ref 0.40–1.20)
GFR: 77.42 mL/min (ref >60.00)
Glucose, Bld: 94 mg/dL (ref 70–99)
POTASSIUM: 3.9 meq/L (ref 3.5–5.1)
SODIUM: 137 meq/L (ref 135–145)

## 2016-04-27 LAB — MICROALBUMIN / CREATININE URINE RATIO
CREATININE, U: 211.2 mg/dL
Microalb Creat Ratio: 0.3 mg/g (ref 0.0–30.0)
Microalb, Ur: 0.7 mg/dL (ref 0.0–1.9)

## 2016-04-27 LAB — TSH: TSH: 4.3 u[IU]/mL (ref 0.35–4.50)

## 2016-04-27 MED ORDER — LOSARTAN POTASSIUM 50 MG PO TABS: 50.0000 mg | ORAL_TABLET | Freq: Every day | ORAL | 3 refills | Status: DC

## 2016-04-27 NOTE — Patient Instructions (Addendum)
Please continue the same medications for diabetes.  check your blood sugar once a day.  vary the time of day when you check, between before the 3 meals, and at bedtime.  also check if you have symptoms of your blood sugar being too high or too low.  please keep a record of the readings and bring it to your next appointment here (or you can bring the meter itself).  You can write it on any piece of paper.  please call us sooner if your blood sugar goes below 70, or if you have a lot of readings over 200.   please call [phone], to get an appointment with a new primary doctor.  blood tests are requested for you today.  We'll let you know about the results.  I have sent a prescription to your pharmacy, for the blood pressure.   Please come back for a follow-up appointment in 6 months.

## 2016-04-27 NOTE — Progress Notes (Signed)
Subjective:    Patient ID: Jordan Holloway, female    DOB: 01-11-1966, 50 y.o.   MRN: [id number]  HPI Pt returns for f/u of diabetes mellitus: DM type: 2 Dx'ed: 123XX123 Complications: none Therapy: trulicity and metformin GDM: 2004 DKA: never. Severe hypoglycemia: never Pancreatitis: never. Other: she had gastric banding in 2011, but she says she cannot afford to go for f/u; She has never been on insulin, except during a pregnancy.   Interval history: no cbg record, but states cbg's are well-controlled.  pt states she feels well in general, except for ongoing weight regain.  HTN: he denies sob and chest pain Past Medical History:  Diagnosis Date  . ASTHMA 06/20/2007  . DIABETES MELLITUS, TYPE II 06/20/2007  . GERD 06/20/2007  . HYPERCHOLESTEROLEMIA 01/05/2010  . HYPERTENSION 11/15/2008  . HYPOTHYROIDISM 01/26/2008  . Polycystic ovary syndrome     Past Surgical History:  Procedure Laterality Date  . LAPAROSCOPIC GASTRIC BANDING  05/13/2009   Dr. [facility]    Social History   Social History  . Marital status: Married    Spouse name: N/A  . Number of children: N/A  . Years of education: N/A   Occupational History  . Not on file.   Social History Main Topics  . Smoking status: Former Research scientist (life sciences)  . Smokeless tobacco: Not on file  . Alcohol use Not on file  . Drug use: Unknown  . Sexual activity: Not on file   Other Topics Concern  . Not on file   Social History Narrative  . No narrative on file    Current Outpatient Prescriptions on File Prior to Visit  Medication Sig Dispense Refill  . ALPRAZolam (XANAX) 0.25 MG tablet Take 0.25 mg by mouth daily as needed.      Marland Kitchen atorvastatin (LIPITOR) 10 MG tablet TAKE 1 TABLET (10 MG TOTAL) BY MOUTH DAILY. 90 tablet 2  . Dulaglutide (TRULICITY) A999333 0000000 SOPN Inject 0.75 mg into the skin once a week. 4 pen 11  . levothyroxine (SYNTHROID, LEVOTHROID) 100 MCG tablet TAKE ONE TABLET BY MOUTH EVERY DAY BEFORE  BREAKFAST. 30 tablet 1  . metFORMIN (GLUCOPHAGE) 500 MG tablet TAKE 1 TABLET BY MOUTH ONCE DAILY WITH BREAKFAST 30 tablet 1  . omeprazole (PRILOSEC) 20 MG capsule Take 1 capsule (20 mg total) by mouth daily. 30 capsule 1  . PARoxetine (PAXIL-CR) 25 MG 24 hr tablet Take 30 mg by mouth every morning.     . SUMAtriptan (IMITREX) 100 MG tablet 1 tablet as needed for migraines     No current facility-administered medications on file prior to visit.     Allergies  Allergen Reactions  . Sulfonamide Derivatives     Family History  Problem Relation Age of Onset  . Hypertension Mother   . Hypertension Father     BP (!) 147/108   Pulse 74   Temp 98.9 F (37.2 C) (Oral)   Ht 5\' 2"  (1.575 m)   Wt 249 lb 9.6 oz (113.2 kg)   LMP 04/25/2016   SpO2 97%   BMI 45.65 kg/m   Review of Systems Denies n/v/numbness    Objective:   Physical Exam VITAL SIGNS:  See vs page GENERAL: no distress Pulses: dorsalis pedis intact bilat.   MSK: no deformity of the feet CV: no leg edema Skin:  no ulcer on the feet.  normal color and temp on the feet. Neuro: sensation is intact to touch on the feet    Lab  Results  Component Value Date   HGBA1C 5.6 04/27/2016      Assessment & Plan:  Type 2 DM: well-controlled HTN: worse Dyslipidemia: well-controlled   Patient is advised the following: Patient Instructions  Please continue the same medications for diabetes.  check your blood sugar once a day.  vary the time of day when you check, between before the 3 meals, and at bedtime.  also check if you have symptoms of your blood sugar being too high or too low.  please keep a record of the readings and bring it to your next appointment here (or you can bring the meter itself).  You can write it on any piece of paper.  please call us sooner if your blood sugar goes below 70, or if you have a lot of readings over 200.   please call [phone], to get an appointment with a new primary doctor.  blood tests  are requested for you today.  We'll let you know about the results.  I have sent a prescription to your pharmacy, for the blood pressure.   Please come back for a follow-up appointment in 6 months.   Renato Shin, MD

## 2016-05-13 ENCOUNTER — Telehealth: Payer: Self-pay

## 2016-05-13 NOTE — Telephone Encounter (Signed)
I contacted the pt and advised of the results below via vm. Requested a call back if the pt would like to discuss.

## 2016-05-13 NOTE — Telephone Encounter (Signed)
-----   Message from Renato Shin, MD sent at 04/27/2016  6:27 PM EDT ----- please call patient: Good results I'll see you next time.

## 2016-05-16 ENCOUNTER — Other Ambulatory Visit: Payer: Self-pay | Admitting: Endocrinology

## 2016-06-16 ENCOUNTER — Other Ambulatory Visit: Payer: Self-pay | Admitting: Endocrinology

## 2016-07-16 ENCOUNTER — Other Ambulatory Visit: Payer: Self-pay | Admitting: Endocrinology

## 2016-07-31 ENCOUNTER — Other Ambulatory Visit: Payer: Self-pay | Admitting: Endocrinology

## 2016-07-31 NOTE — Telephone Encounter (Signed)
please call patient: Please ask your pcp to refill this

## 2016-08-03 ENCOUNTER — Other Ambulatory Visit: Payer: Self-pay

## 2016-08-03 MED ORDER — LOSARTAN POTASSIUM 50 MG PO TABS: 50.0000 mg | ORAL_TABLET | Freq: Every day | ORAL | 3 refills | Status: DC

## 2016-08-15 ENCOUNTER — Other Ambulatory Visit: Payer: Self-pay | Admitting: Endocrinology

## 2016-09-12 ENCOUNTER — Other Ambulatory Visit: Payer: Self-pay | Admitting: Endocrinology

## 2016-09-30 ENCOUNTER — Other Ambulatory Visit: Payer: Self-pay | Admitting: Endocrinology

## 2016-10-24 NOTE — Progress Notes (Signed)
Subjective:    Patient ID: Jordan Holloway, female    DOB: 1966-02-02, 51 y.o.   MRN: [id number]  HPI Pt returns for f/u of diabetes mellitus: DM type: 2 Dx'ed: 123XX123 Complications: none Therapy: trulicity and metformin.   GDM: 2004 DKA: never. Severe hypoglycemia: never Pancreatitis: never. Other: she had gastric banding in 2011, but she says she cannot afford to go for f/u (but she is still well below her preop weight); She has never been on insulin, except during a pregnancy.   Interval history: no cbg record, but states cbg's are well-controlled.  pt states she feels well in general, except for ongoing weight regain.  Past Medical History:  Diagnosis Date  . ASTHMA 06/20/2007  . DIABETES MELLITUS, TYPE II 06/20/2007  . GERD 06/20/2007  . HYPERCHOLESTEROLEMIA 01/05/2010  . HYPERTENSION 11/15/2008  . HYPOTHYROIDISM 01/26/2008  . Polycystic ovary syndrome     Past Surgical History:  Procedure Laterality Date  . LAPAROSCOPIC GASTRIC BANDING  05/13/2009   Dr. Karle [facility]    Social History   Social History  . Marital status: Married    Spouse name: N/A  . Number of children: N/A  . Years of education: N/A   Occupational History  . Not on file.   Social History Main Topics  . Smoking status: Former Research scientist (life sciences)  . Smokeless tobacco: Never Used  . Alcohol use Not on file  . Drug use: Unknown  . Sexual activity: Not on file   Other Topics Concern  . Not on file   Social History Narrative  . No narrative on file    Current Outpatient Prescriptions on File Prior to Visit  Medication Sig Dispense Refill  . ALPRAZolam (XANAX) 0.25 MG tablet Take 0.25 mg by mouth daily as needed.      Marland Kitchen atorvastatin (LIPITOR) 10 MG tablet TAKE 1 TABLET (10 MG TOTAL) BY MOUTH DAILY. 90 tablet 2  . levothyroxine (SYNTHROID, LEVOTHROID) 100 MCG tablet TAKE ONE TABLET BY MOUTH DAILY BEFORE BREAKFAST 30 tablet 0  . losartan (COZAAR) 50 MG tablet Take 1 tablet (50 mg total) by  mouth daily. 30 tablet 3  . metFORMIN (GLUCOPHAGE) 500 MG tablet TAKE ONE TABLET BY MOUTH DAILY WITH BREAKFAST 30 tablet 0  . omeprazole (PRILOSEC) 20 MG capsule Take 1 capsule (20 mg total) by mouth daily. 30 capsule 1  . PARoxetine (PAXIL-CR) 25 MG 24 hr tablet Take 30 mg by mouth every morning.     . SUMAtriptan (IMITREX) 100 MG tablet 1 tablet as needed for migraines    . TRULICITY A999333 0000000 SOPN INJECT 0.75 MG INTO THE SKIN ONCE A WEEK. 4 pen 10   No current facility-administered medications on file prior to visit.     Allergies  Allergen Reactions  . Sulfonamide Derivatives     Family History  Problem Relation Age of Onset  . Hypertension Mother   . Hypertension Father     BP 132/88   Pulse 77   Ht 5\' 2"  (1.575 m)   Wt 253 lb (114.8 kg)   SpO2 98%   BMI 46.27 kg/m   Review of Systems She denies hypoglycemia. Denies n/v.  She seldom has regurgitation.      Objective:   Physical Exam VITAL SIGNS:  See vs page GENERAL: no distress Pulses: dorsalis pedis intact bilat.   MSK: no deformity of the feet CV: no leg edema Skin:  no ulcer on the feet.  normal color and temp on the  feet. Neuro: sensation is intact to touch on the feet.  A1c=5.7%    Assessment & Plan:  Type 2 DM: well-controlled.   Obesity: persistent.   Patient is advised the following: Patient Instructions  Please continue the same medications for diabetes.  check your blood sugar once a day.  vary the time of day when you check, between before the 3 meals, and at bedtime.  also check if you have symptoms of your blood sugar being too high or too low.  please keep a record of the readings and bring it to your next appointment here (or you can bring the meter itself).  You can write it on any piece of paper.  please call us sooner if your blood sugar goes below 70, or if you have a lot of readings over 200.   please call [phone], to get an appointment with a new primary provider.  Please come back  for a follow-up appointment in 6 months.     You should consider additional weight-loss surgery: Bariatric Surgery You have so much to gain by losing weight.  You may have already tried every diet and exercise plan imaginable.  And, you may have sought advice from your family physician, too.   Sometimes, in spite of such diligent efforts, you may not be able to achieve long-term results by yourself.  In cases of severe obesity, bariatric or weight loss surgery is a proven method of achieving long-term weight control.  Our Services Our bariatric surgery programs offer our patients new hope and long-term weight-loss solution.  Since introducing our services in 2003, we have conducted more than 2,400 successful procedures.  Our program is designated as a Programmer, multimedia by the Metabolic and Bariatric Surgery Accreditation and Quality Improvement Program (MBSAQIP), a IT trainer that sets rigorous patient safety and outcome standards.  Our program is also designated as a Ecologist by [employer].   Our exceptional weight-loss surgery team specializes in diagnosis, treatment, follow-up care, and ongoing support for our patients with severe weight loss challenges.  We currently offer laparoscopic sleeve gastrectomy, gastric bypass, and adjustable gastric band (LAP-BAND).    Attend our [place] Choosing to undergo a bariatric procedure is a big decision, and one that should not be taken lightly.  You now have two options in how you learn about weight-loss surgery - in person or online.  Our objective is to ensure you have all of the information that you need to evaluate the advantages and obligations of this life changing procedure.  Please note that you are not alone in this process, and our experienced team is ready to assist and answer all of your questions.  There are several ways to register for a seminar (either on-line or in person): 1)  Call  [phone] 2) Go on-line to [facility] and register for either type of seminar.  [email or web address]

## 2016-10-26 ENCOUNTER — Other Ambulatory Visit: Payer: Self-pay | Admitting: Endocrinology

## 2016-10-27 ENCOUNTER — Encounter: Payer: Self-pay | Admitting: Endocrinology

## 2016-10-27 ENCOUNTER — Ambulatory Visit (INDEPENDENT_AMBULATORY_CARE_PROVIDER_SITE_OTHER): Payer: BC Managed Care – PPO | Admitting: Endocrinology

## 2016-10-27 VITALS — BP 132/88 | HR 77 | Ht 62.0 in | Wt 253.0 lb

## 2016-10-27 DIAGNOSIS — E119 Type 2 diabetes mellitus without complications: Secondary | ICD-10-CM

## 2016-10-27 LAB — POCT GLYCOSYLATED HEMOGLOBIN (HGB A1C): HEMOGLOBIN A1C: 5.7

## 2016-10-27 NOTE — Patient Instructions (Addendum)
Please continue the same medications for diabetes.  check your blood sugar once a day.  vary the time of day when you check, between before the 3 meals, and at bedtime.  also check if you have symptoms of your blood sugar being too high or too low.  please keep a record of the readings and bring it to your next appointment here (or you can bring the meter itself).  You can write it on any piece of paper.  please call us sooner if your blood sugar goes below 70, or if you have a lot of readings over 200.   please call [phone], to get an appointment with a new primary provider.  Please come back for a follow-up appointment in 6 months.     You should consider additional weight-loss surgery: Bariatric Surgery You have so much to gain by losing weight.  You may have already tried every diet and exercise plan imaginable.  And, you may have sought advice from your family physician, too.   Sometimes, in spite of such diligent efforts, you may not be able to achieve long-term results by yourself.  In cases of severe obesity, bariatric or weight loss surgery is a proven method of achieving long-term weight control.  Our Services Our bariatric surgery programs offer our patients new hope and long-term weight-loss solution.  Since introducing our services in 2003, we have conducted more than 2,400 successful procedures.  Our program is designated as a Programmer, multimedia by the Metabolic and Bariatric Surgery Accreditation and Quality Improvement Program (MBSAQIP), a IT trainer that sets rigorous patient safety and outcome standards.  Our program is also designated as a Ecologist by [employer].   Our exceptional weight-loss surgery team specializes in diagnosis, treatment, follow-up care, and ongoing support for our patients with severe weight loss challenges.  We currently offer laparoscopic sleeve gastrectomy, gastric bypass, and adjustable gastric band (LAP-BAND).     Attend our [place] Choosing to undergo a bariatric procedure is a big decision, and one that should not be taken lightly.  You now have two options in how you learn about weight-loss surgery - in person or online.  Our objective is to ensure you have all of the information that you need to evaluate the advantages and obligations of this life changing procedure.  Please note that you are not alone in this process, and our experienced team is ready to assist and answer all of your questions.  There are several ways to register for a seminar (either on-line or in person): 1)  Call [phone] 2) Go on-line to [facility] and register for either type of seminar.  [email or web address]

## 2016-11-13 ENCOUNTER — Other Ambulatory Visit: Payer: Self-pay | Admitting: Endocrinology

## 2016-12-02 LAB — HM DIABETES EYE EXAM

## 2016-12-12 ENCOUNTER — Other Ambulatory Visit: Payer: Self-pay | Admitting: Endocrinology

## 2016-12-20 ENCOUNTER — Other Ambulatory Visit (HOSPITAL_COMMUNITY): Payer: Self-pay | Admitting: Surgery

## 2017-01-11 ENCOUNTER — Other Ambulatory Visit: Payer: Self-pay | Admitting: Endocrinology

## 2017-01-12 ENCOUNTER — Inpatient Hospital Stay (HOSPITAL_COMMUNITY): Admission: RE | Admit: 2017-01-12 | Payer: BC Managed Care – PPO | Source: Ambulatory Visit

## 2017-01-12 ENCOUNTER — Ambulatory Visit (HOSPITAL_COMMUNITY): Payer: BC Managed Care – PPO

## 2017-01-13 ENCOUNTER — Other Ambulatory Visit: Payer: Self-pay | Admitting: Endocrinology

## 2017-02-08 ENCOUNTER — Other Ambulatory Visit: Payer: Self-pay | Admitting: Endocrinology

## 2017-02-21 ENCOUNTER — Encounter: Payer: BC Managed Care – PPO | Attending: General Surgery | Admitting: Skilled Nursing Facility1

## 2017-02-21 ENCOUNTER — Encounter: Payer: Self-pay | Admitting: Skilled Nursing Facility1

## 2017-02-21 DIAGNOSIS — Z713 Dietary counseling and surveillance: Secondary | ICD-10-CM | POA: Diagnosis not present

## 2017-02-21 DIAGNOSIS — E119 Type 2 diabetes mellitus without complications: Secondary | ICD-10-CM

## 2017-02-21 NOTE — Progress Notes (Signed)
Pre-Op Assessment Visit:  Pre-Operative Sleeve Gastrectomy Surgery  Medical Nutrition Therapy:  Appt start time: 3:58  End time:  4:49  Patient was seen on 02/21/2017 for Pre-Operative Nutrition Assessment. Assessment and letter of approval faxed to [facility] Bariatric Surgery Program coordinator on 02/21/2017.  Like to be called Jordan Holloway.   Pt expectation of surgery: to have more success than having the lapband.   Pt expectation of Dietitian: "to show me the smartest ways to eat"  Pt states she does not check her blood sugar. Pt states her last A1C was 5.7.  Pt states she would like to be 150 pounds. Pt states meat are hard to tolerate due to lapband.   Start weight at [facility]: 254.2 BMI: 50.48   24 hr Dietary Recall: First Meal: protein bar Snack:  Second Meal: rollup sandwich and mashed potatoes  Snack: bread Third Meal: cereal---baked potato Snack: crackers Beverages: unsweet tea, diet coke  Encouraged to engage in 150 minutes of moderate physical activity including cardiovascular and weight baring weekly  Handouts given during visit include:  . Pre-Op Goals . Bariatric Surgery Protein Shakes During the appointment today the following Pre-Op Goals were reviewed with the patient: . Maintain or lose weight as instructed by your surgeon . Make healthy food choices . Begin to limit portion sizes . Limited concentrated sugars and fried foods . Keep fat/sugar in the single digits per serving on             food labels . Practice CHEWING your food  (aim for 30 chews per bite or until applesauce consistency) . Practice not drinking 15 minutes before, during, and 30 minutes after each meal/snack . Avoid all carbonated beverages  . Avoid/limit caffeinated beverages  . Avoid all sugar-sweetened beverages . Consume 3 meals per day; eat every 3-5 hours . Make a list of non-food related activities . Aim for 64-100 ounces of FLUID daily  . Aim for at least 60-80 grams of  PROTEIN daily . Look for a liquid protein source that contain ?15 g protein and ?5 g carbohydrate  (ex: shakes, drinks, shots)  -Follow diet recommendations listed below   Energy and Macronutrient Recomendations: Calories: 1500 Carbohydrate: 170 Protein: 112 Fat: 42  Demonstrated degree of understanding via:  Teach Back  Teaching Method Utilized:  Visual Auditory Hands on  Barriers to learning/adherence to lifestyle change: none identified   Patient to call the [facility] to enroll in Pre-Op and Post-Op Nutrition Education when surgery date is scheduled.

## 2017-02-21 NOTE — Patient Instructions (Addendum)
Follow Pre-Op Goals Try Protein Shakes Call [facility] at [phone] when surgery is scheduled to enroll in Pre-Op Class  Things to remember:  Please always be honest with [place]. We want to support you!  If you have any questions or concerns in between appointments, please call or email   The diet after surgery will be high protein and low in carbohydrate.  Vitamins and calcium need to be taken for the rest of your life.  Feel free to include support people in any classes or appointments.  Call your insurance company to verify how many (if any at all) months you need of supervised weight loss before surgery   Be sure to dispose of your needle in a hard plastic container    Supplement recommendations:  Before Surgery   1 Complete Multivitamin with Iron  3000 IU Vitamin D3  After Surgery   2 Chewable Multivitamins  **Best Choice - Opurity: Bypass and Sleeve Optimized Chewable    Bariatric Advantage Advanced Multi EA      3 Chewable Calcium (500 mg each, total 1200-1500 mg per day)  **Best Choice - Celebrate, Bariatric Advantage, or Wellesse  Other Options:  2 Celebrate MultiComplete with 18 mg Iron (this provides 6000 IU of  Vitamin D3)  4 Celebrate Essential Multi 2 in 1 (has calcium) + 18-60 mg separate  iron  Vitamins and Calcium are available at:   [facility]   [place], [place], [place] [address]   www.bariatricadvantage.com  www.celebratevitamins.com  www.amazon.com

## 2017-03-17 ENCOUNTER — Other Ambulatory Visit: Payer: Self-pay | Admitting: Endocrinology

## 2017-03-18 ENCOUNTER — Other Ambulatory Visit: Payer: Self-pay

## 2017-03-18 ENCOUNTER — Ambulatory Visit (HOSPITAL_COMMUNITY)
Admission: RE | Admit: 2017-03-18 | Discharge: 2017-03-18 | Disposition: A | Discharge status: Home / Self Care | Payer: BC Managed Care – PPO | Source: Ambulatory Visit | Attending: Surgery | Admitting: Surgery

## 2017-03-18 DIAGNOSIS — Z9884 Bariatric surgery status: Secondary | ICD-10-CM | POA: Insufficient documentation

## 2017-03-18 DIAGNOSIS — Z01818 Encounter for other preprocedural examination: Secondary | ICD-10-CM | POA: Insufficient documentation

## 2017-03-25 ENCOUNTER — Other Ambulatory Visit (HOSPITAL_COMMUNITY): Payer: BC Managed Care – PPO

## 2017-03-25 ENCOUNTER — Ambulatory Visit (HOSPITAL_COMMUNITY): Payer: BC Managed Care – PPO

## 2017-03-28 ENCOUNTER — Encounter: Payer: BC Managed Care – PPO | Attending: General Surgery | Admitting: Skilled Nursing Facility1

## 2017-03-28 DIAGNOSIS — E119 Type 2 diabetes mellitus without complications: Secondary | ICD-10-CM

## 2017-03-28 DIAGNOSIS — Z713 Dietary counseling and surveillance: Secondary | ICD-10-CM | POA: Insufficient documentation

## 2017-03-29 ENCOUNTER — Telehealth: Payer: Self-pay | Admitting: Endocrinology

## 2017-03-29 ENCOUNTER — Telehealth: Payer: Self-pay

## 2017-03-29 NOTE — Telephone Encounter (Signed)
Called patient back and advised to return call to speak with a nurse, I would be glad to help her. Left call back number.

## 2017-03-29 NOTE — Telephone Encounter (Signed)
Patient asking to speak with providers nurse. No details given, just asked for return phone call when possible.

## 2017-03-29 NOTE — Telephone Encounter (Signed)
No, I don't need to see you prior.  please come back after.  I hope you feel well.

## 2017-03-29 NOTE — Telephone Encounter (Signed)
Submitted questions to MD.

## 2017-03-29 NOTE — Telephone Encounter (Signed)
returning missed call  -LL

## 2017-03-29 NOTE — Telephone Encounter (Signed)
Called patient regarding questions. Patient states she is having the lap band reversal and gastic surgery on July 16th, she would like to know if you need to see her before the surgery or after. If you need to see her before she will be out of town from July 7-14th..Please advise Thank you!

## 2017-03-30 ENCOUNTER — Telehealth: Payer: Self-pay

## 2017-03-30 NOTE — Telephone Encounter (Signed)
Called LVM advising patient of MD's note. Left call back number.

## 2017-03-30 NOTE — Progress Notes (Signed)
  Pre-Operative Nutrition Class:  Appt start time: 6203   End time:  1830.  Patient was seen on 03/28/17 for Pre-Operative Bariatric Surgery Education [facility].   Surgery date:  Surgery type: Sleeve Gastrectomy Start weight at [facility]: 254.2  Weight today: Pt deined  TANITA  BODY COMP RESULTS     BMI (kg/m^2)    Fat Mass (lbs)    Fat Free Mass (lbs)    Total Body Water (lbs)    Samples given per MNT protocol. Patient educated on appropriate usage: Bariatric Advantage Multivitamin Lot # [id number] Exp: 06/19  Bariatric Fusion Calcium Citrate Lot # [id number] Exp: 02/02/18  Premier Clear Protein Drink Lot # [id number] Exp: 08/30/17  The following the learning objectives were met by the patient during this course:  Identify Pre-Op Dietary Goals and will begin 2 weeks pre-operatively  Identify appropriate sources of fluids and proteins   State protein recommendations and appropriate sources pre and post-operatively  Identify Post-Operative Dietary Goals and will follow for 2 weeks post-operatively  Identify appropriate multivitamin and calcium sources  Describe the need for physical activity post-operatively and will follow MD recommendations  State when to call healthcare provider regarding medication questions or post-operative complications  Handouts given during class include:  Pre-Op Bariatric Surgery Diet Handout  Protein Shake Handout  Post-Op Bariatric Surgery Nutrition Handout  [facility] Program Information Flyer  Support Group Information Flyer  [facility] Bariatric Supplements Price List  Follow-Up Plan: Patient will follow-up at [facility] 2 weeks post operatively for diet advancement per MD.

## 2017-04-15 ENCOUNTER — Other Ambulatory Visit: Payer: Self-pay | Admitting: Endocrinology

## 2017-04-18 ENCOUNTER — Inpatient Hospital Stay: Admit: 2017-04-18 | Payer: BC Managed Care – PPO | Admitting: Surgery

## 2017-04-18 SURGERY — LAPAROSCOPIC GASTRIC BAND REMOVAL WITH LAPAROSCOPIC GASTRIC SLEEVE RESECTION
Anesthesia: General

## 2017-04-26 ENCOUNTER — Ambulatory Visit: Payer: BC Managed Care – PPO | Admitting: Endocrinology

## 2017-04-29 ENCOUNTER — Telehealth: Payer: Self-pay | Admitting: Endocrinology

## 2017-04-29 NOTE — Telephone Encounter (Signed)
Okay to send a letter? Thanks!

## 2017-04-29 NOTE — Telephone Encounter (Signed)
Patient attempting to contact provider. Patient states she needs a letter sent to [facility] (Dr. Hassell Done is the doctor) stating why she needed procedure Dr. Loanne Drilling recommended. Asking for return phone call when possible.

## 2017-04-29 NOTE — Telephone Encounter (Signed)
What kind of surgery? If weight loss surgery, we need to know what weight loss efforts you have undertaken, when, and what the results have been?

## 2017-05-03 ENCOUNTER — Encounter: Payer: Self-pay | Admitting: Endocrinology

## 2017-05-03 ENCOUNTER — Ambulatory Visit: Payer: BC Managed Care – PPO

## 2017-05-03 NOTE — Telephone Encounter (Signed)
Called patient and sending her letter in the mail.

## 2017-05-03 NOTE — Telephone Encounter (Signed)
Has tried to do the lap band diet but cant tolerate the protein especially the chicken. She throws up any protein that's eaten. She has tried protein bars and shakes that have helped some. She states she lost about 10lbs so far.

## 2017-05-03 NOTE — Telephone Encounter (Signed)
I have done.  It is on epic

## 2017-05-14 ENCOUNTER — Other Ambulatory Visit: Payer: Self-pay | Admitting: Endocrinology

## 2017-05-25 ENCOUNTER — Ambulatory Visit: Payer: BC Managed Care – PPO | Admitting: Endocrinology

## 2017-06-12 ENCOUNTER — Other Ambulatory Visit: Payer: Self-pay | Admitting: Endocrinology

## 2017-06-13 NOTE — Telephone Encounter (Signed)
Please refill x 1 Ov is due  

## 2017-06-15 ENCOUNTER — Other Ambulatory Visit: Payer: Self-pay | Admitting: Endocrinology

## 2017-06-20 ENCOUNTER — Ambulatory Visit: Payer: BC Managed Care – PPO | Admitting: Endocrinology

## 2017-07-10 ENCOUNTER — Other Ambulatory Visit: Payer: Self-pay | Admitting: Endocrinology

## 2017-07-10 NOTE — Telephone Encounter (Signed)
Please refill x 1 Ov is due  

## 2017-07-11 ENCOUNTER — Other Ambulatory Visit: Payer: Self-pay

## 2017-07-11 MED ORDER — LEVOTHYROXINE SODIUM 100 MCG PO TABS: 100.0000 ug | ORAL_TABLET | Freq: Every day | ORAL | 1 refills | Status: DC

## 2017-07-12 ENCOUNTER — Other Ambulatory Visit: Payer: Self-pay | Admitting: Endocrinology

## 2017-07-13 ENCOUNTER — Encounter: Payer: Self-pay | Admitting: Endocrinology

## 2017-07-13 ENCOUNTER — Other Ambulatory Visit: Payer: Self-pay | Admitting: Endocrinology

## 2017-07-13 ENCOUNTER — Ambulatory Visit (INDEPENDENT_AMBULATORY_CARE_PROVIDER_SITE_OTHER): Payer: BC Managed Care – PPO | Admitting: Endocrinology

## 2017-07-13 VITALS — BP 130/76 | HR 79 | Wt 234.2 lb

## 2017-07-13 DIAGNOSIS — E119 Type 2 diabetes mellitus without complications: Secondary | ICD-10-CM | POA: Diagnosis not present

## 2017-07-13 LAB — POCT GLYCOSYLATED HEMOGLOBIN (HGB A1C): HEMOGLOBIN A1C: 5.5

## 2017-07-13 NOTE — Patient Instructions (Addendum)
Please continue the same medications for diabetes.  check your blood sugar once a day.  vary the time of day when you check, between before the 3 meals, and at bedtime.  also check if you have symptoms of your blood sugar being too high or too low.  please keep a record of the readings and bring it to your next appointment here (or you can bring the meter itself).  You can write it on any piece of paper.  please call us sooner if your blood sugar goes below 70, or if you have a lot of readings over 200.   If the appeal for the surgery does not help, you could ask Dr Hassell Done if adding 1/10 cc to the band would help.   Please come back for a follow-up appointment in 6 months.

## 2017-07-13 NOTE — Progress Notes (Signed)
Subjective:    Patient ID: Jordan Holloway, female    DOB: [birth year], 51 y.o.   MRN: [id number]  HPI Pt returns for f/u of diabetes mellitus: DM type: 2 Dx'ed: 8338 Complications: none Therapy: trulicity and metformin.   GDM: 2004 DKA: never. Severe hypoglycemia: never Pancreatitis: never. Other: she had gastric banding in 2011, but she says she cannot afford to go for f/u (but she is still well below her preop weight); She has never been on insulin, except during a pregnancy.   Interval history: no cbg record, but states cbg's are well-controlled.  pt states she feels well in general, except for ongoing weight regain.  She is appealing decision for insurance for surgery to decline conversion of gastric band to sleeve.  Past Medical History:  Diagnosis Date  . ASTHMA 06/20/2007  . DIABETES MELLITUS, TYPE II 06/20/2007  . GERD 06/20/2007  . HYPERCHOLESTEROLEMIA 01/05/2010  . HYPERTENSION 11/15/2008  . HYPOTHYROIDISM 01/26/2008  . Polycystic ovary syndrome     Past Surgical History:  Procedure Laterality Date  . LAPAROSCOPIC GASTRIC BANDING  05/13/2009   Dr. Karle Barr Surg    Social History   Social History  . Marital status: Married    Spouse name: N/A  . Number of children: N/A  . Years of education: N/A   Occupational History  . Not on file.   Social History Main Topics  . Smoking status: Former Research scientist (life sciences)  . Smokeless tobacco: Never Used  . Alcohol use Not on file  . Drug use: Unknown  . Sexual activity: Not on file   Other Topics Concern  . Not on file   Social History Narrative  . No narrative on file    Current Outpatient Prescriptions on File Prior to Visit  Medication Sig Dispense Refill  . ALPRAZolam (XANAX) 0.25 MG tablet Take 0.25 mg by mouth daily as needed.      Marland Kitchen levothyroxine (SYNTHROID, LEVOTHROID) 100 MCG tablet Take 1 tablet (100 mcg total) by mouth daily with breakfast. 30 tablet 1  . losartan (COZAAR) 50 MG tablet Take 1 tablet  (50 mg total) by mouth daily. 30 tablet 3  . metFORMIN (GLUCOPHAGE) 500 MG tablet TAKE ONE TABLET BY MOUTH DAILY BEFORE BREAKFAST 30 tablet 0  . omeprazole (PRILOSEC) 20 MG capsule Take 1 capsule (20 mg total) by mouth daily. 30 capsule 1  . PARoxetine (PAXIL-CR) 25 MG 24 hr tablet Take 30 mg by mouth every morning.     . SUMAtriptan (IMITREX) 100 MG tablet 1 tablet as needed for migraines    . TRULICITY 2.50 NL/9.7QB SOPN INJECT 0.75 MG INTO THE SKIN ONCE A WEEK. 4 pen 10   No current facility-administered medications on file prior to visit.     Allergies  Allergen Reactions  . Sulfonamide Derivatives     Family History  Problem Relation Age of Onset  . Hypertension Mother   . Hypertension Father     BP 130/76   Pulse 79   Wt 234 lb 3.2 oz (106.2 kg)   SpO2 97%   BMI 46.51 kg/m    Review of Systems She denies hypoglycemia.  She has lost 19 lbs since last ov.      Objective:   Physical Exam VITAL SIGNS:  See vs page GENERAL: no distress Pulses: foot pulses are intact bilaterally.   MSK: no deformity of the feet or ankles.  CV: no edema of the legs or ankles.  Skin:  no ulcer on the  feet or ankles.  normal color and temp on the feet and ankles.  Neuro: sensation is intact to touch on the feet and ankles.    Lab Results  Component Value Date   HGBA1C 5.5 07/13/2017      Assessment & Plan:  Type 2 DM: well-controlled.  Obesity: improved, due to pt's efforts.   Patient Instructions  Please continue the same medications for diabetes.  check your blood sugar once a day.  vary the time of day when you check, between before the 3 meals, and at bedtime.  also check if you have symptoms of your blood sugar being too high or too low.  please keep a record of the readings and bring it to your next appointment here (or you can bring the meter itself).  You can write it on any piece of paper.  please call us sooner if your blood sugar goes below 70, or if you have a lot of  readings over 200.   If the appeal for the surgery does not help, you could ask Dr Hassell Done if adding 1/10 cc to the band would help.   Please come back for a follow-up appointment in 6 months.

## 2017-07-18 ENCOUNTER — Other Ambulatory Visit: Payer: Self-pay | Admitting: Endocrinology

## 2017-08-10 ENCOUNTER — Other Ambulatory Visit: Payer: Self-pay | Admitting: Endocrinology

## 2017-10-03 ENCOUNTER — Other Ambulatory Visit: Payer: Self-pay | Admitting: Endocrinology

## 2017-10-09 ENCOUNTER — Other Ambulatory Visit: Payer: Self-pay | Admitting: Endocrinology

## 2017-10-09 NOTE — Telephone Encounter (Signed)
I only see pt for DM.  This needs to be refilled by PCP.  If she does have one, she should schedule with one.

## 2017-10-17 ENCOUNTER — Other Ambulatory Visit: Payer: Self-pay | Admitting: Endocrinology

## 2017-10-17 NOTE — Telephone Encounter (Signed)
Please refer request to PCP.  If pt does not have one, she should get one.

## 2017-10-24 ENCOUNTER — Other Ambulatory Visit: Payer: Self-pay | Admitting: Endocrinology

## 2017-10-27 ENCOUNTER — Other Ambulatory Visit: Payer: Self-pay | Admitting: Endocrinology

## 2017-11-06 ENCOUNTER — Other Ambulatory Visit: Payer: Self-pay | Admitting: Endocrinology

## 2017-12-03 ENCOUNTER — Other Ambulatory Visit: Payer: Self-pay | Admitting: Endocrinology

## 2018-01-01 ENCOUNTER — Other Ambulatory Visit: Payer: Self-pay | Admitting: Endocrinology

## 2018-01-11 ENCOUNTER — Ambulatory Visit: Payer: BC Managed Care – PPO | Admitting: Endocrinology

## 2018-01-17 ENCOUNTER — Other Ambulatory Visit: Payer: Self-pay | Admitting: Endocrinology

## 2018-01-20 ENCOUNTER — Other Ambulatory Visit: Payer: Self-pay | Admitting: Endocrinology

## 2018-01-27 ENCOUNTER — Other Ambulatory Visit: Payer: Self-pay | Admitting: Endocrinology

## 2018-02-01 ENCOUNTER — Ambulatory Visit: Payer: BC Managed Care – PPO | Admitting: Endocrinology

## 2018-02-01 ENCOUNTER — Encounter: Payer: Self-pay | Admitting: Endocrinology

## 2018-02-01 VITALS — BP 122/84 | HR 69 | Wt 225.0 lb

## 2018-02-01 DIAGNOSIS — Z23 Encounter for immunization: Secondary | ICD-10-CM | POA: Diagnosis not present

## 2018-02-01 DIAGNOSIS — E119 Type 2 diabetes mellitus without complications: Secondary | ICD-10-CM

## 2018-02-01 LAB — POCT GLYCOSYLATED HEMOGLOBIN (HGB A1C): HEMOGLOBIN A1C: 5.4

## 2018-02-01 NOTE — Progress Notes (Signed)
Subjective:    Patient ID: Jordan Holloway, female    DOB: 12-11-1965, 52 y.o.   MRN: [id number]  HPI Pt returns for f/u of diabetes mellitus: DM type: 2 Dx'ed: 4098 Complications: none Therapy: trulicity and metformin.   GDM: 2004 DKA: never. Severe hypoglycemia: never Pancreatitis: never. Other: she had gastric banding in 2011, but she says she cannot afford to go for f/u (but she is still well below her preop weight); She has never been on insulin, except during a pregnancy.   Interval history: no cbg record, but states cbg's are well-controlled.  pt states she feels well in general.  She says copay here is very high.     Past Medical History:  Diagnosis Date  . ASTHMA 06/20/2007  . DIABETES MELLITUS, TYPE II 06/20/2007  . GERD 06/20/2007  . HYPERCHOLESTEROLEMIA 01/05/2010  . HYPERTENSION 11/15/2008  . HYPOTHYROIDISM 01/26/2008  . Polycystic ovary syndrome     Past Surgical History:  Procedure Laterality Date  . LAPAROSCOPIC GASTRIC BANDING  05/13/2009   Dr. Karle Barr Surg    Social History   Socioeconomic History  . Marital status: Married    Spouse name: Not on file  . Number of children: Not on file  . Years of education: Not on file  . Highest education level: Not on file  Occupational History  . Not on file  Social Needs  . Financial resource strain: Not on file  . Food insecurity:    Worry: Not on file    Inability: Not on file  . Transportation needs:    Medical: Not on file    Non-medical: Not on file  Tobacco Use  . Smoking status: Former Research scientist (life sciences)  . Smokeless tobacco: Never Used  Substance and Sexual Activity  . Alcohol use: Not on file  . Drug use: Not on file  . Sexual activity: Not on file  Lifestyle  . Physical activity:    Days per week: Not on file    Minutes per session: Not on file  . Stress: Not on file  Relationships  . Social connections:    Talks on phone: Not on file    Gets together: Not on file    Attends  religious service: Not on file    Active member of club or organization: Not on file    Attends meetings of clubs or organizations: Not on file    Relationship status: Not on file  . Intimate partner violence:    Fear of current or ex partner: Not on file    Emotionally abused: Not on file    Physically abused: Not on file    Forced sexual activity: Not on file  Other Topics Concern  . Not on file  Social History Narrative  . Not on file    Current Outpatient Medications on File Prior to Visit  Medication Sig Dispense Refill  . ALPRAZolam (XANAX) 0.25 MG tablet Take 0.25 mg by mouth daily as needed.      Marland Kitchen atorvastatin (LIPITOR) 10 MG tablet TAKE ONE TABLET BY MOUTH DAILY 90 tablet 0  . levothyroxine (SYNTHROID, LEVOTHROID) 100 MCG tablet Take 1 tablet (100 mcg total) by mouth daily with breakfast. 30 tablet 1  . losartan (COZAAR) 50 MG tablet Take 1 tablet (50 mg total) by mouth daily. 30 tablet 3  . metFORMIN (GLUCOPHAGE) 500 MG tablet TAKE ONE TABLET BY MOUTH DAILY BEFORE BREAKFAST 30 tablet 0  . omeprazole (PRILOSEC) 20 MG capsule Take 1 capsule (  20 mg total) by mouth daily. 30 capsule 1  . PARoxetine (PAXIL-CR) 25 MG 24 hr tablet Take 30 mg by mouth every morning.     . SUMAtriptan (IMITREX) 100 MG tablet 1 tablet as needed for migraines    . TRULICITY 1.95 KD/3.2IZ SOPN INJECT 0.75 MG UNDER THE SKIN ONCE WEEKLY 2 pen 9   No current facility-administered medications on file prior to visit.     Allergies  Allergen Reactions  . Sulfonamide Derivatives     Family History  Problem Relation Age of Onset  . Hypertension Mother   . Hypertension Father     BP 122/84 (BP Location: Right Wrist, Patient Position: Sitting, Cuff Size: Normal)   Pulse 69   Wt 225 lb (102.1 kg)   SpO2 95%   BMI 44.68 kg/m    Review of Systems She has lost weight, due to her efforts.      Objective:   Physical Exam VITAL SIGNS:  See vs page GENERAL: no distress Pulses: dorsalis pedis  intact bilat.   MSK: no deformity of the feet CV: no leg edema Skin:  no ulcer on the feet.  normal color and temp on the feet. Neuro: sensation is intact to touch on the feet.   Lab Results  Component Value Date   HGBA1C 5.4 02/01/2018      Assessment & Plan:  Type 2 DM: well-controlled Obesity: improved, due to pt's efforts.   Patient Instructions  Please continue the same medications.   check your blood sugar once a day.  vary the time of day when you check, between before the 3 meals, and at bedtime.  also check if you have symptoms of your blood sugar being too high or too low.  please keep a record of the readings and bring it to your next appointment here (or you can bring the meter itself).  You can write it on any piece of paper.  please call us sooner if your blood sugar goes below 70, or if you have a lot of readings over 200.   please call [phone] [facility] Lawrence Santiago), to get an appointment with a new primary doctor.   I would be happy to see you back here as needed.

## 2018-02-01 NOTE — Patient Instructions (Addendum)
Please continue the same medications.   check your blood sugar once a day.  vary the time of day when you check, between before the 3 meals, and at bedtime.  also check if you have symptoms of your blood sugar being too high or too low.  please keep a record of the readings and bring it to your next appointment here (or you can bring the meter itself).  You can write it on any piece of paper.  please call us sooner if your blood sugar goes below 70, or if you have a lot of readings over 200.   please call [phone] [facility] Lawrence Santiago), to get an appointment with a new primary doctor.   I would be happy to see you back here as needed.

## 2018-02-14 ENCOUNTER — Encounter: Payer: Self-pay | Admitting: Obstetrics and Gynecology

## 2018-02-24 ENCOUNTER — Other Ambulatory Visit: Payer: Self-pay | Admitting: Endocrinology

## 2018-03-16 ENCOUNTER — Encounter: Payer: Self-pay | Admitting: Gastroenterology

## 2018-03-20 ENCOUNTER — Emergency Department (HOSPITAL_COMMUNITY)
Admission: EM | Admit: 2018-03-20 | Discharge: 2018-03-20 | Disposition: A | Discharge status: Home / Self Care | Payer: BC Managed Care – PPO | Attending: Emergency Medicine | Admitting: Emergency Medicine

## 2018-03-20 ENCOUNTER — Encounter (HOSPITAL_COMMUNITY): Payer: Self-pay | Admitting: Family Medicine

## 2018-03-20 DIAGNOSIS — R1110 Vomiting, unspecified: Secondary | ICD-10-CM | POA: Insufficient documentation

## 2018-03-20 DIAGNOSIS — Z5321 Procedure and treatment not carried out due to patient leaving prior to being seen by health care provider: Secondary | ICD-10-CM | POA: Insufficient documentation

## 2018-03-20 DIAGNOSIS — R197 Diarrhea, unspecified: Secondary | ICD-10-CM | POA: Diagnosis not present

## 2018-03-20 DIAGNOSIS — M545 Low back pain: Secondary | ICD-10-CM | POA: Insufficient documentation

## 2018-03-20 LAB — URINALYSIS, ROUTINE W REFLEX MICROSCOPIC
BILIRUBIN URINE: NEGATIVE
Glucose, UA: NEGATIVE mg/dL
Hgb urine dipstick: NEGATIVE
KETONES UR: NEGATIVE mg/dL
LEUKOCYTES UA: NEGATIVE
Nitrite: NEGATIVE
PH: 5 (ref 5.0–8.0)
Protein, ur: NEGATIVE mg/dL
Specific Gravity, Urine: 1.025 (ref 1.005–1.030)

## 2018-03-20 NOTE — ED Notes (Signed)
Called Pt in lobby for vital recheck, no response in lobby x3. 

## 2018-03-20 NOTE — ED Triage Notes (Signed)
Patient reports she is experiencing vomiting, diarrhea, and lower back pain. Symptoms started 2 days ago. Patient denies abd pain except the use of abd muscle for vomiting. Denies fever. Patient reports she has took [employer], and Phenergan (rectal) with no relief except to sleep.

## 2018-03-20 NOTE — ED Notes (Signed)
Called Pt in lobby for vital recheck, no response in lobby x1. 

## 2018-03-20 NOTE — ED Notes (Signed)
No answer for blood draw @ this time. 

## 2018-04-01 ENCOUNTER — Other Ambulatory Visit: Payer: Self-pay | Admitting: Endocrinology

## 2018-04-15 ENCOUNTER — Other Ambulatory Visit: Payer: Self-pay | Admitting: Endocrinology

## 2018-04-15 NOTE — Telephone Encounter (Signed)
Please refer request to PCP 

## 2018-04-28 ENCOUNTER — Other Ambulatory Visit: Payer: Self-pay | Admitting: Endocrinology

## 2018-05-02 ENCOUNTER — Ambulatory Visit (AMBULATORY_SURGERY_CENTER): Payer: Self-pay

## 2018-05-02 VITALS — Ht 60.0 in | Wt 223.6 lb

## 2018-05-02 DIAGNOSIS — Z1211 Encounter for screening for malignant neoplasm of colon: Secondary | ICD-10-CM

## 2018-05-02 MED ORDER — PEG-KCL-NACL-NASULF-NA ASC-C 140 G PO SOLR: 1.0000 | Freq: Once | ORAL | 0 refills | Status: AC

## 2018-05-02 NOTE — Progress Notes (Signed)
Denies allergies to eggs or soy products. Denies complication of anesthesia or sedation. Denies use of weight loss medication. Denies use of O2.   Emmi instructions declined.  

## 2018-05-04 ENCOUNTER — Encounter: Payer: Self-pay | Admitting: Gastroenterology

## 2018-05-17 ENCOUNTER — Encounter: Payer: Self-pay | Admitting: Gastroenterology

## 2018-05-17 ENCOUNTER — Ambulatory Visit (AMBULATORY_SURGERY_CENTER): Payer: BC Managed Care – PPO | Admitting: Gastroenterology

## 2018-05-17 VITALS — BP 132/82 | HR 59 | Temp 98.0°F | Resp 14 | Ht 60.0 in | Wt 223.0 lb

## 2018-05-17 DIAGNOSIS — D120 Benign neoplasm of cecum: Secondary | ICD-10-CM

## 2018-05-17 DIAGNOSIS — Z1211 Encounter for screening for malignant neoplasm of colon: Secondary | ICD-10-CM

## 2018-05-17 MED ORDER — SODIUM CHLORIDE 0.9 % IV SOLN: 500.0000 mL | Freq: Once | INTRAVENOUS | Status: DC

## 2018-05-17 NOTE — Patient Instructions (Signed)
YOU HAD AN ENDOSCOPIC PROCEDURE TODAY AT THE [facility]:   Refer to the procedure report that was given to you for any specific questions about what was found during the examination.  If the procedure report does not answer your questions, please call your gastroenterologist to clarify.  If you requested that your care partner not be given the details of your procedure findings, then the procedure report has been included in a sealed envelope for you to review at your convenience later.  YOU SHOULD EXPECT: Some feelings of bloating in the abdomen. Passage of more gas than usual.  Walking can help get rid of the air that was put into your GI tract during the procedure and reduce the bloating. If you had a lower endoscopy (such as a colonoscopy or flexible sigmoidoscopy) you may notice spotting of blood in your stool or on the toilet paper. If you underwent a bowel prep for your procedure, you may not have a normal bowel movement for a few days.  Please Note:  You might notice some irritation and congestion in your nose or some drainage.  This is from the oxygen used during your procedure.  There is no need for concern and it should clear up in a day or so.  SYMPTOMS TO REPORT IMMEDIATELY:   Following lower endoscopy (colonoscopy or flexible sigmoidoscopy):  Excessive amounts of blood in the stool  Significant tenderness or worsening of abdominal pains  Swelling of the abdomen that is new, acute  Fever of 100F or higher   For urgent or emergent issues, a gastroenterologist can be reached at any hour by calling [phone].   DIET:  We do recommend a small meal at first, but then you may proceed to your regular diet.  Drink plenty of fluids but you should avoid alcoholic beverages for 24 hours.  ACTIVITY:  You should plan to take it easy for the rest of today and you should NOT DRIVE or use heavy machinery until tomorrow (because of the sedation medicines used during the test).     FOLLOW UP: Our staff will call the number listed on your records the next business day following your procedure to check on you and address any questions or concerns that you may have regarding the information given to you following your procedure. If we do not reach you, we will leave a message.  However, if you are feeling well and you are not experiencing any problems, there is no need to return our call.  We will assume that you have returned to your regular daily activities without incident.  If any biopsies were taken you will be contacted by phone or by letter within the next 1-3 weeks.  Please call us at [phone] if you have not heard about the biopsies in 3 weeks.    SIGNATURES/CONFIDENTIALITY: You and/or your care partner have signed paperwork which will be entered into your electronic medical record.  These signatures attest to the fact that that the information above on your After Visit Summary has been reviewed and is understood.  Full responsibility of the confidentiality of this discharge information lies with you and/or your care-partner.  Polyp information given. 

## 2018-05-17 NOTE — Op Note (Signed)
[facility] Patient Name: Jordan Holloway Procedure Date: 05/17/2018 11:01 AM MRN: [id number] Endoscopist: Mallie Mussel L. Loletha Carrow , MD Age: 52 Referring MD:  Date of Birth: 01-01-1966 Gender: Female Account #: [id number] Procedure:                Colonoscopy Indications:              Screening for colorectal malignant neoplasm, This                            is the patient's first colonoscopy Medicines:                Monitored Anesthesia Care Procedure:                Pre-Anesthesia Assessment:                           - Prior to the procedure, a History and Physical                            was performed, and patient medications and                            allergies were reviewed. The patient's tolerance of                            previous anesthesia was also reviewed. The risks                            and benefits of the procedure and the sedation                            options and risks were discussed with the patient.                            All questions were answered, and informed consent                            was obtained. Prior Anticoagulants: The patient has                            taken no previous anticoagulant or antiplatelet                            agents. ASA Grade Assessment: II - A patient with                            mild systemic disease. After reviewing the risks                            and benefits, the patient was deemed in                            satisfactory condition to undergo the procedure.  After obtaining informed consent, the colonoscope                            was passed under direct vision. Throughout the                            procedure, the patient's blood pressure, pulse, and                            oxygen saturations were monitored continuously. The                            Colonoscope was introduced through the anus and                            advanced to the the  cecum, identified by                            appendiceal orifice and ileocecal valve. The                            colonoscopy was performed without difficulty. The                            patient tolerated the procedure well. The quality                            of the bowel preparation was excellent. The                            ileocecal valve, appendiceal orifice, and rectum                            were photographed. The quality of the bowel                            preparation was evaluated using the BBPS [facility]                            Bowel Preparation Scale) with scores of: Right                            Colon = 3, Transverse Colon = 3 and Left Colon = 3                            (entire mucosa seen well with no residual staining,                            small fragments of stool or opaque liquid). The                            total BBPS score equals 9. Scope In: 11:03:57 AM Scope Out: 11:19:05 AM Scope Withdrawal Time: 0 hours 10  minutes 37 seconds  Total Procedure Duration: 0 hours 15 minutes 8 seconds  Findings:                 The perianal and digital rectal examinations were                            normal.                           A 5 mm polyp was found in the cecum. The polyp was                            sessile. The polyp was removed with a cold snare.                            Resection and retrieval were complete.                           The exam was otherwise without abnormality on                            direct and retroflexion views. Complications:            No immediate complications. Estimated Blood Loss:     Estimated blood loss was minimal. Impression:               - One 5 mm polyp in the cecum, removed with a cold                            snare. Resected and retrieved.                           - The examination was otherwise normal on direct                            and retroflexion views. Recommendation:           -  Patient has a contact number available for                            emergencies. The signs and symptoms of potential                            delayed complications were discussed with the                            patient. Return to normal activities tomorrow.                            Written discharge instructions were provided to the                            patient.                           - Resume previous diet.                           -  Continue present medications.                           - Await pathology results.                           - Repeat colonoscopy is recommended for                            surveillance. The colonoscopy date will be                            determined after pathology results from today's                            exam become available for review. Ollin Hochmuth L. Loletha Carrow, MD 05/17/2018 11:21:19 AM This report has been signed electronically.

## 2018-05-17 NOTE — Progress Notes (Signed)
A/ox3, pleased with MAC, report to RN 

## 2018-05-17 NOTE — Progress Notes (Signed)
Pt's states no medical or surgical changes since previsit or office visit. 

## 2018-05-17 NOTE — Progress Notes (Signed)
Called to room to assist during endoscopic procedure.  Patient ID and intended procedure confirmed with present staff. Received instructions for my participation in the procedure from the performing physician.  

## 2018-05-18 ENCOUNTER — Telehealth: Payer: Self-pay

## 2018-05-18 NOTE — Telephone Encounter (Signed)
  Follow up Call-  Call back number 05/17/2018  Post procedure Call Back phone  # [phone]  Permission to leave phone message Yes  Some recent data might be hidden     No ID on answering machine.  No message left. Angela/Call-back [facility]

## 2018-05-18 NOTE — Telephone Encounter (Signed)
Attempted to reach pt. With follow-up call following endoscopic procedure 05/17/2018.  LM on pt. Voice mail to call if she has any questions or concerns.

## 2018-05-23 ENCOUNTER — Encounter: Payer: Self-pay | Admitting: Gastroenterology

## 2018-05-29 ENCOUNTER — Other Ambulatory Visit: Payer: Self-pay | Admitting: Endocrinology

## 2018-07-01 ENCOUNTER — Other Ambulatory Visit: Payer: Self-pay | Admitting: Endocrinology

## 2018-07-11 ENCOUNTER — Other Ambulatory Visit: Payer: Self-pay | Admitting: Endocrinology

## 2018-07-27 ENCOUNTER — Other Ambulatory Visit: Payer: Self-pay | Admitting: Endocrinology

## 2018-07-27 NOTE — Telephone Encounter (Signed)
Please address refill request 

## 2018-07-31 ENCOUNTER — Other Ambulatory Visit: Payer: Self-pay | Admitting: Endocrinology

## 2018-09-03 ENCOUNTER — Other Ambulatory Visit: Payer: Self-pay | Admitting: Endocrinology

## 2018-09-03 NOTE — Telephone Encounter (Signed)
Options: Request refill from new PCP, or: refill x 1.  Ov is due

## 2018-09-07 ENCOUNTER — Other Ambulatory Visit: Payer: Self-pay | Admitting: Endocrinology

## 2018-10-04 ENCOUNTER — Other Ambulatory Visit: Payer: Self-pay | Admitting: Endocrinology

## 2018-10-14 ENCOUNTER — Other Ambulatory Visit: Payer: Self-pay | Admitting: Endocrinology

## 2018-10-14 NOTE — Telephone Encounter (Signed)
Please forward refill request to pt's new primary care provider.  

## 2018-10-24 ENCOUNTER — Other Ambulatory Visit: Payer: Self-pay | Admitting: Endocrinology

## 2018-10-25 ENCOUNTER — Other Ambulatory Visit: Payer: Self-pay | Admitting: Endocrinology

## 2018-10-25 NOTE — Telephone Encounter (Signed)
Please forward refill request to pt's new primary care provider.  

## 2018-10-25 NOTE — Telephone Encounter (Signed)
Please advise if refill is appropriate 

## 2018-10-26 ENCOUNTER — Other Ambulatory Visit: Payer: Self-pay | Admitting: Endocrinology

## 2018-11-01 ENCOUNTER — Other Ambulatory Visit: Payer: Self-pay | Admitting: Endocrinology

## 2018-12-04 ENCOUNTER — Other Ambulatory Visit: Payer: Self-pay | Admitting: Endocrinology

## 2018-12-05 ENCOUNTER — Other Ambulatory Visit: Payer: Self-pay | Admitting: Endocrinology

## 2018-12-11 ENCOUNTER — Encounter: Payer: Self-pay | Admitting: Endocrinology

## 2018-12-11 ENCOUNTER — Other Ambulatory Visit: Payer: Self-pay

## 2018-12-11 ENCOUNTER — Ambulatory Visit: Payer: BC Managed Care – PPO | Admitting: Endocrinology

## 2018-12-11 VITALS — BP 122/70 | HR 68 | Ht 60.0 in | Wt 236.6 lb

## 2018-12-11 DIAGNOSIS — E119 Type 2 diabetes mellitus without complications: Secondary | ICD-10-CM | POA: Diagnosis not present

## 2018-12-11 LAB — POCT GLYCOSYLATED HEMOGLOBIN (HGB A1C): Hemoglobin A1C: 5.7 % — AB (ref 4.0–5.6)

## 2018-12-11 NOTE — Progress Notes (Signed)
Subjective:    Patient ID: Jordan Holloway, female    DOB: 1966-06-25, 53 y.o.   MRN: [id number]  HPI Pt returns for f/u of diabetes mellitus: DM type: 2 Dx'ed: 2355 Complications: none Therapy: trulicity and metformin.   GDM: 2004 DKA: never. Severe hypoglycemia: never Pancreatitis: never. Other: she had gastric banding in 2011, but she says she cannot afford to go for f/u (but she is still well below her preop weight); She has never been on insulin, except during a pregnancy.   Interval history: no cbg record, but states cbg's are well-controlled.  pt states she feels well in general.   Past Medical History:  Diagnosis Date  . Anemia   . Anxiety   . ASTHMA 06/20/2007   Patient denies   . DIABETES MELLITUS, TYPE II 06/20/2007  . GERD 06/20/2007  . Heart murmur   . History of migraine headaches   . HYPERCHOLESTEROLEMIA 01/05/2010  . HYPERTENSION 11/15/2008  . HYPOTHYROIDISM 01/26/2008  . Polycystic ovary syndrome     Past Surgical History:  Procedure Laterality Date  . Broken Clavicle    . CESAREAN SECTION     2  . Head Injury    . KNEE SURGERY    . LAPAROSCOPIC GASTRIC BANDING  05/13/2009   Dr. Karle Barr Surg    Social History   Socioeconomic History  . Marital status: Married    Spouse name: Not on file  . Number of children: Not on file  . Years of education: Not on file  . Highest education level: Not on file  Occupational History  . Not on file  Social Needs  . Financial resource strain: Not on file  . Food insecurity:    Worry: Not on file    Inability: Not on file  . Transportation needs:    Medical: Not on file    Non-medical: Not on file  Tobacco Use  . Smoking status: Former Research scientist (life sciences)  . Smokeless tobacco: Never Used  . Tobacco comment: Quit 26 years ago.  Substance and Sexual Activity  . Alcohol use: Yes    Comment: rarely  . Drug use: Not Currently  . Sexual activity: Not on file  Lifestyle  . Physical activity:    Days per  week: Not on file    Minutes per session: Not on file  . Stress: Not on file  Relationships  . Social connections:    Talks on phone: Not on file    Gets together: Not on file    Attends religious service: Not on file    Active member of club or organization: Not on file    Attends meetings of clubs or organizations: Not on file    Relationship status: Not on file  . Intimate partner violence:    Fear of current or ex partner: Not on file    Emotionally abused: Not on file    Physically abused: Not on file    Forced sexual activity: Not on file  Other Topics Concern  . Not on file  Social History Narrative  . Not on file    Current Outpatient Medications on File Prior to Visit  Medication Sig Dispense Refill  . ALPRAZolam (XANAX) 0.25 MG tablet Take 0.25 mg by mouth daily as needed.      Marland Kitchen atorvastatin (LIPITOR) 10 MG tablet TAKE ONE TABLET BY MOUTH DAILY 90 tablet 0  . levothyroxine (SYNTHROID, LEVOTHROID) 100 MCG tablet TAKE 1 TABLET BY MOUTH EVERY MORNING BEFORE BREAKFAST  90 tablet PRN  . losartan (COZAAR) 50 MG tablet Take 1 tablet (50 mg total) by mouth daily. 30 tablet 3  . metFORMIN (GLUCOPHAGE) 500 MG tablet TAKE ONE TABLET BY MOUTH DAILY BEFORE BREAKFAST 30 tablet 0  . omeprazole (PRILOSEC) 20 MG capsule Take 1 capsule (20 mg total) by mouth daily. 30 capsule 1  . PARoxetine (PAXIL-CR) 25 MG 24 hr tablet Take 30 mg by mouth every morning.     . SUMAtriptan (IMITREX) 100 MG tablet 1 tablet as needed for migraines    . TRULICITY 4.91 PH/1.5AV SOPN INJECT 0.75 MG UNDER THE SKIN ONCE WEEKLY 2 pen 8   No current facility-administered medications on file prior to visit.     Allergies  Allergen Reactions  . Sulfonamide Derivatives     Family History  Problem Relation Age of Onset  . Hypertension Mother   . Hypertension Father   . Colon cancer Neg Hx   . Esophageal cancer Neg Hx   . Rectal cancer Neg Hx   . Stomach cancer Neg Hx     BP 122/70 (BP Location: Right  Arm, Patient Position: Sitting, Cuff Size: Large)   Pulse 68   Ht 5' (1.524 m)   Wt 236 lb 9.6 oz (107.3 kg)   LMP 04/25/2016   SpO2 94%   BMI 46.21 kg/m    Review of Systems Denies nausea.     Objective:   Physical Exam VITAL SIGNS:  See vs page GENERAL: no distress Pulses: dorsalis pedis intact bilat.   MSK: no deformity of the feet CV: no leg edema Skin:  no ulcer on the feet.  normal color and temp on the feet.  Neuro: sensation is intact to touch on the feet.   A1c=5.7%     Assessment & Plan:  Type 2 DM: well-controlled Obesity: stable after surgery  Patient Instructions  Please continue the same medications.   check your blood sugar once a day.  vary the time of day when you check, between before the 3 meals, and at bedtime.  also check if you have symptoms of your blood sugar being too high or too low.  please keep a record of the readings and bring it to your next appointment here (or you can bring the meter itself).  You can write it on any piece of paper.  please call us sooner if your blood sugar goes below 70, or if you have a lot of readings over 200.   Please come back for a follow-up appointment in 1 year.

## 2018-12-11 NOTE — Patient Instructions (Addendum)
Please continue the same medications.   check your blood sugar once a day.  vary the time of day when you check, between before the 3 meals, and at bedtime.  also check if you have symptoms of your blood sugar being too high or too low.  please keep a record of the readings and bring it to your next appointment here (or you can bring the meter itself).  You can write it on any piece of paper.  please call us sooner if your blood sugar goes below 70, or if you have a lot of readings over 200.   Please come back for a follow-up appointment in 1 year.   

## 2018-12-27 ENCOUNTER — Other Ambulatory Visit: Payer: Self-pay | Admitting: Endocrinology

## 2019-01-21 ENCOUNTER — Other Ambulatory Visit: Payer: Self-pay | Admitting: Endocrinology

## 2019-01-21 NOTE — Telephone Encounter (Signed)
Please forward refill request to pt's new primary care provider.  

## 2019-01-23 ENCOUNTER — Other Ambulatory Visit: Payer: Self-pay | Admitting: Endocrinology

## 2019-01-25 ENCOUNTER — Telehealth: Payer: Self-pay | Admitting: Endocrinology

## 2019-01-25 NOTE — Telephone Encounter (Signed)
Please forward refill request to pt's primary care provider.   

## 2019-01-26 NOTE — Telephone Encounter (Signed)
Patient called in regards to the denial, Informed on the information below. Agreed and will establish with a primary.

## 2019-02-20 ENCOUNTER — Other Ambulatory Visit: Payer: Self-pay | Admitting: Endocrinology

## 2019-03-27 ENCOUNTER — Other Ambulatory Visit: Payer: Self-pay | Admitting: Endocrinology

## 2019-04-27 ENCOUNTER — Other Ambulatory Visit: Payer: Self-pay | Admitting: Endocrinology

## 2019-05-23 ENCOUNTER — Other Ambulatory Visit: Payer: Self-pay | Admitting: Endocrinology

## 2019-06-23 ENCOUNTER — Other Ambulatory Visit: Payer: Self-pay | Admitting: Endocrinology

## 2019-07-06 ENCOUNTER — Telehealth: Payer: Self-pay | Admitting: Endocrinology

## 2019-07-06 NOTE — Telephone Encounter (Signed)
Patient called to get advise on how to return to the classroom safely with her Diabetes

## 2019-07-06 NOTE — Telephone Encounter (Signed)
Patient is currently still having concerns with going back and considering of taking a leave of absences. 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 Patient currently has family that is fighting for their child not to be required to wear a mask at all.   Patient is wanting to know her options if it doesn't work out.  Please Advise, Thanks

## 2019-07-06 NOTE — Telephone Encounter (Signed)
Please advise 

## 2019-07-06 NOTE — Telephone Encounter (Signed)
Per Dr. 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 Will need to ensure she follows her facility's COVID protocols for her safety and student/parent/facaulty safety. LVM requesting returned call.

## 2019-07-06 NOTE — Telephone Encounter (Signed)
Per Dr. Loanne Drilling, he is unable to provide pt with options that would help to reduce her concerns. In his professional opinion, he still does not feel her diabetes qualifies as a reason that would prohibit her ability to return to work or to perform her job duties. LVM requesting returned call

## 2019-07-06 NOTE — Telephone Encounter (Signed)
The diabetes does not qualify you for being out of work.  However, you may have sone other reason--that is a different subject

## 2019-07-06 NOTE — Telephone Encounter (Signed)
thr diabetes does not preclude you from going

## 2019-07-22 ENCOUNTER — Other Ambulatory Visit: Payer: Self-pay | Admitting: Endocrinology

## 2019-08-11 ENCOUNTER — Other Ambulatory Visit: Payer: Self-pay | Admitting: Endocrinology

## 2019-08-12 ENCOUNTER — Other Ambulatory Visit: Payer: Self-pay | Admitting: Endocrinology

## 2019-08-19 ENCOUNTER — Other Ambulatory Visit: Payer: Self-pay | Admitting: Endocrinology

## 2019-09-22 ENCOUNTER — Other Ambulatory Visit: Payer: Self-pay | Admitting: Endocrinology

## 2019-11-02 ENCOUNTER — Other Ambulatory Visit: Payer: Self-pay | Admitting: Endocrinology

## 2019-11-06 ENCOUNTER — Other Ambulatory Visit: Payer: Self-pay | Admitting: Endocrinology

## 2019-12-11 ENCOUNTER — Other Ambulatory Visit: Payer: Self-pay

## 2019-12-11 ENCOUNTER — Ambulatory Visit: Payer: BC Managed Care – PPO | Admitting: Endocrinology

## 2019-12-12 ENCOUNTER — Other Ambulatory Visit: Payer: Self-pay

## 2019-12-13 ENCOUNTER — Encounter: Payer: Self-pay | Admitting: Endocrinology

## 2019-12-13 ENCOUNTER — Ambulatory Visit: Payer: BC Managed Care – PPO | Admitting: Endocrinology

## 2019-12-13 VITALS — BP 110/70 | HR 76 | Ht 60.0 in | Wt 239.4 lb

## 2019-12-13 DIAGNOSIS — E119 Type 2 diabetes mellitus without complications: Secondary | ICD-10-CM | POA: Diagnosis not present

## 2019-12-13 LAB — POCT GLYCOSYLATED HEMOGLOBIN (HGB A1C): Hemoglobin A1C: 5.5 % (ref 4.0–5.6)

## 2019-12-13 MED ORDER — TRULICITY 0.75 MG/0.5ML ~~LOC~~ SOAJ: 0.7500 mg | SUBCUTANEOUS | 3 refills | Status: DC

## 2019-12-13 NOTE — Progress Notes (Signed)
Subjective:    Patient ID: Jordan Holloway, female    DOB: [birth year], 54 y.o.   MRN: [id number]  HPI Pt returns for f/u of diabetes mellitus: DM type: 2 Dx'ed: [id number] Complications: none Therapy: trulicity and metformin.   GDM: 2009 DKA: never. Severe hypoglycemia: never Pancreatitis: never. Other: she had gastric banding in 2011, but she says she cannot afford to go for f/u (but she is still well below her preop weight); she says ins declined additional surgery; She has never been on insulin, except during a pregnancy.   Interval history: no cbg record, but states cbg's are well-controlled.  pt states she feels well in general.  She takes meds as rx'ed.   Past Medical History:  Diagnosis Date  . Anemia   . Anxiety   . ASTHMA 06/20/2007   Patient denies   . DIABETES MELLITUS, TYPE II 06/20/2007  . GERD 06/20/2007  . Heart murmur   . History of migraine headaches   . HYPERCHOLESTEROLEMIA 01/05/2010  . HYPERTENSION 11/15/2008  . HYPOTHYROIDISM 01/26/2008  . Polycystic ovary syndrome     Past Surgical History:  Procedure Laterality Date  . Broken Clavicle    . CESAREAN SECTION     2  . Head Injury    . KNEE SURGERY    . LAPAROSCOPIC GASTRIC BANDING  05/13/2009   Dr. Karle Barr Surg    Social History   Socioeconomic History  . Marital status: Married    Spouse name: Not on file  . Number of children: Not on file  . Years of education: Not on file  . Highest education level: Not on file  Occupational History  . Not on file  Tobacco Use  . Smoking status: Former Research scientist (life sciences)  . Smokeless tobacco: Never Used  . Tobacco comment: Quit 26 years ago.  Substance and Sexual Activity  . Alcohol use: Yes    Comment: rarely  . Drug use: Not Currently  . Sexual activity: Not on file  Other Topics Concern  . Not on file  Social History Narrative  . Not on file   Social Determinants of Health   Financial Resource Strain:   . Difficulty of Paying Living  Expenses:   Food Insecurity:   . Worried About Charity fundraiser in the Last Year:   . Arboriculturist in the Last Year:   Transportation Needs:   . Film/video editor (Medical):   Marland Kitchen Lack of Transportation (Non-Medical):   Physical Activity:   . Days of Exercise per Week:   . Minutes of Exercise per Session:   Stress:   . Feeling of Stress :   Social Connections:   . Frequency of Communication with Friends and Family:   . Frequency of Social Gatherings with Friends and Family:   . Attends Religious Services:   . Active Member of Clubs or Organizations:   . Attends Archivist Meetings:   Marland Kitchen Marital Status:   Intimate Partner Violence:   . Fear of Current or Ex-Partner:   . Emotionally Abused:   Marland Kitchen Physically Abused:   . Sexually Abused:     Current Outpatient Medications on File Prior to Visit  Medication Sig Dispense Refill  . ALPRAZolam (XANAX) 0.25 MG tablet Take 0.25 mg by mouth daily as needed.      Marland Kitchen atorvastatin (LIPITOR) 10 MG tablet TAKE ONE TABLET BY MOUTH DAILY 90 tablet 0  . levothyroxine (SYNTHROID) 100 MCG tablet TAKE ONE TABLET BY  MOUTH EVERY MORNING BEFORE BREAKFAST 90 tablet PRN  . losartan (COZAAR) 50 MG tablet Take 1 tablet (50 mg total) by mouth daily. 30 tablet 3  . metFORMIN (GLUCOPHAGE) 500 MG tablet TAKE ONE TABLET BY MOUTH EVERY MORNING BEFORE BREAKFAST 30 tablet 0  . omeprazole (PRILOSEC) 20 MG capsule Take 1 capsule (20 mg total) by mouth daily. 30 capsule 1  . PARoxetine (PAXIL-CR) 25 MG 24 hr tablet Take 30 mg by mouth every morning.     . SUMAtriptan (IMITREX) 100 MG tablet 1 tablet as needed for migraines     No current facility-administered medications on file prior to visit.    Allergies  Allergen Reactions  . Sulfonamide Derivatives     Family History  Problem Relation Age of Onset  . Hypertension Mother   . Hypertension Father   . Colon cancer Neg Hx   . Esophageal cancer Neg Hx   . Rectal cancer Neg Hx   . Stomach  cancer Neg Hx     BP 110/70   Pulse 76   Ht 5' (1.524 m)   Wt 239 lb 6.4 oz (108.6 kg)   LMP 04/25/2016   SpO2 96%   BMI 46.75 kg/m    Review of Systems Denies nausea and weight change.      Objective:   Physical Exam VITAL SIGNS:  See vs page GENERAL: no distress Pulses: dorsalis pedis intact bilat.   MSK: no deformity of the feet CV: no leg edema Skin:  no ulcer on the feet, but there are bilat calluses.  normal color and temp on the feet. Neuro: sensation is intact to touch on the feet  Lab Results  Component Value Date   HGBA1C 5.5 12/13/2019      Assessment & Plan:  Type 2 DM: well-controlled Obesity, persistent: continuing meds will help this  Patient Instructions  Please continue the same medications.   check your blood sugar once a day.  vary the time of day when you check, between before the 3 meals, and at bedtime.  also check if you have symptoms of your blood sugar being too high or too low.  please keep a record of the readings and bring it to your next appointment here (or you can bring the meter itself).  You can write it on any piece of paper.  please call us sooner if your blood sugar goes below 70, or if you have a lot of readings over 200.   Please come back for a follow-up appointment in 1 year.

## 2019-12-13 NOTE — Patient Instructions (Signed)
Please continue the same medications.   check your blood sugar once a day.  vary the time of day when you check, between before the 3 meals, and at bedtime.  also check if you have symptoms of your blood sugar being too high or too low.  please keep a record of the readings and bring it to your next appointment here (or you can bring the meter itself).  You can write it on any piece of paper.  please call us sooner if your blood sugar goes below 70, or if you have a lot of readings over 200.   Please come back for a follow-up appointment in 1 year.

## 2019-12-24 ENCOUNTER — Other Ambulatory Visit: Payer: Self-pay | Admitting: Endocrinology

## 2020-01-22 ENCOUNTER — Other Ambulatory Visit: Payer: Self-pay | Admitting: Endocrinology

## 2020-02-20 ENCOUNTER — Other Ambulatory Visit: Payer: Self-pay | Admitting: Endocrinology

## 2020-03-21 ENCOUNTER — Other Ambulatory Visit: Payer: Self-pay | Admitting: Endocrinology

## 2020-08-11 ENCOUNTER — Telehealth: Payer: Self-pay

## 2020-08-11 ENCOUNTER — Other Ambulatory Visit: Payer: Self-pay | Admitting: *Deleted

## 2020-08-11 DIAGNOSIS — E119 Type 2 diabetes mellitus without complications: Secondary | ICD-10-CM

## 2020-08-11 MED ORDER — TRULICITY 0.75 MG/0.5ML ~~LOC~~ SOAJ: 0.7500 mg | SUBCUTANEOUS | 3 refills | Status: DC

## 2020-08-11 NOTE — Telephone Encounter (Signed)
New message    Pt c/o medication issue:  1. Name of Medication: Dulaglutide (TRULICITY) 0.03 BC/4.8GQ SOPN  2. How are you currently taking this medication (dosage and times per day)? Once a week   3. Are you having a reaction (difficulty breathing--STAT)? No   4. What is your medication issue? Weight loss down 48 lbs . Issues with prescription not been refill   5. Jordan Holloway on [place] / asking for a call back to discuss

## 2020-08-11 NOTE — Telephone Encounter (Signed)
Refilled Rx Trulicity 9.79 mg sent to [facility]

## 2020-08-27 ENCOUNTER — Other Ambulatory Visit: Payer: Self-pay | Admitting: Obstetrics and Gynecology

## 2020-08-27 DIAGNOSIS — R928 Other abnormal and inconclusive findings on diagnostic imaging of breast: Secondary | ICD-10-CM

## 2020-09-08 ENCOUNTER — Ambulatory Visit
Admission: RE | Admit: 2020-09-08 | Discharge: 2020-09-08 | Disposition: A | Discharge status: Home / Self Care | Payer: BC Managed Care – PPO | Source: Ambulatory Visit | Attending: Obstetrics and Gynecology | Admitting: Obstetrics and Gynecology

## 2020-09-08 ENCOUNTER — Other Ambulatory Visit: Payer: Self-pay

## 2020-09-08 DIAGNOSIS — R928 Other abnormal and inconclusive findings on diagnostic imaging of breast: Secondary | ICD-10-CM

## 2020-10-17 ENCOUNTER — Other Ambulatory Visit: Payer: Self-pay | Admitting: Endocrinology

## 2020-12-12 ENCOUNTER — Other Ambulatory Visit: Payer: Self-pay | Admitting: Endocrinology

## 2020-12-12 DIAGNOSIS — E119 Type 2 diabetes mellitus without complications: Secondary | ICD-10-CM

## 2020-12-17 ENCOUNTER — Other Ambulatory Visit: Payer: Self-pay

## 2020-12-17 ENCOUNTER — Ambulatory Visit: Payer: BC Managed Care – PPO | Admitting: Endocrinology

## 2020-12-17 VITALS — BP 100/78 | HR 69 | Ht 61.0 in | Wt 180.8 lb

## 2020-12-17 DIAGNOSIS — E119 Type 2 diabetes mellitus without complications: Secondary | ICD-10-CM

## 2020-12-17 LAB — POCT GLYCOSYLATED HEMOGLOBIN (HGB A1C): Hemoglobin A1C: 5.1 % (ref 4.0–5.6)

## 2020-12-17 LAB — BASIC METABOLIC PANEL
BUN: 26 mg/dL — ABNORMAL HIGH (ref 6–23)
CO2: 28 mEq/L (ref 19–32)
Calcium: 9.4 mg/dL (ref 8.4–10.5)
Chloride: 101 mEq/L (ref 96–112)
Creatinine, Ser: 0.68 mg/dL (ref 0.40–1.20)
GFR: 98.75 mL/min (ref >60.00)
Glucose, Bld: 95 mg/dL (ref 70–99)
Potassium: 3.8 mEq/L (ref 3.5–5.1)
Sodium: 138 mEq/L (ref 135–145)

## 2020-12-17 LAB — LIPID PANEL
Cholesterol: 155 mg/dL (ref 0–200)
HDL: 58.6 mg/dL (ref >39.00)
LDL Cholesterol: 71 mg/dL (ref 0–99)
NonHDL: 96.43
Total CHOL/HDL Ratio: 3
Triglycerides: 125 mg/dL (ref 0.0–149.0)
VLDL: 25 mg/dL (ref 0.0–40.0)

## 2020-12-17 LAB — TSH: TSH: 0.85 u[IU]/mL (ref 0.35–4.50)

## 2020-12-17 MED ORDER — LOSARTAN POTASSIUM 25 MG PO TABS: 25.0000 mg | ORAL_TABLET | Freq: Every day | ORAL | 3 refills | Status: DC

## 2020-12-17 NOTE — Progress Notes (Signed)
Subjective:    Patient ID: Jordan Holloway, female    DOB: 1966/07/16, 55 y.o.   MRN: [id number]  HPI Pt returns for f/u of diabetes mellitus: DM type: 2 Dx'ed: 6606 Complications: none Therapy: Trulicity and metformin.   GDM: 2009 DKA: never. Severe hypoglycemia: never Pancreatitis: never. Other: she had gastric banding in 2011, but she says she cannot afford to go for f/u (but she is still well below her preop weight); she says ins declined additional surgery; She has never been on insulin, except during a pregnancy.   Interval history: she seldom checks cbg.  pt states she feels well in general.  She takes meds as rx'ed.  She has lost 49 lbs since last ov.   Past Medical History:  Diagnosis Date  . Anemia   . Anxiety   . ASTHMA 06/20/2007   Patient denies   . DIABETES MELLITUS, TYPE II 06/20/2007  . GERD 06/20/2007  . Heart murmur   . History of migraine headaches   . HYPERCHOLESTEROLEMIA 01/05/2010  . HYPERTENSION 11/15/2008  . HYPOTHYROIDISM 01/26/2008  . Polycystic ovary syndrome     Past Surgical History:  Procedure Laterality Date  . Broken Clavicle    . CESAREAN SECTION     2  . Head Injury    . KNEE SURGERY    . LAPAROSCOPIC GASTRIC BANDING  05/13/2009   Dr. Karle Barr [facility]    Social History   Socioeconomic History  . Marital status: Married    Spouse name: Not on file  . Number of children: Not on file  . Years of education: Not on file  . Highest education level: Not on file  Occupational History  . Not on file  Tobacco Use  . Smoking status: Former Research scientist (life sciences)  . Smokeless tobacco: Never Used  . Tobacco comment: Quit 26 years ago.  Vaping Use  . Vaping Use: Never used  Substance and Sexual Activity  . Alcohol use: Yes    Comment: rarely  . Drug use: Not Currently  . Sexual activity: Not on file  Other Topics Concern  . Not on file  Social History Narrative  . Not on file   Social Determinants of Health   Financial Resource  Strain: Not on file  Food Insecurity: Not on file  Transportation Needs: Not on file  Physical Activity: Not on file  Stress: Not on file  Social Connections: Not on file  Intimate Partner Violence: Not on file    Current Outpatient Medications on File Prior to Visit  Medication Sig Dispense Refill  . ALPRAZolam (XANAX) 0.25 MG tablet Take 0.25 mg by mouth daily as needed.    Marland Kitchen atorvastatin (LIPITOR) 10 MG tablet TAKE ONE TABLET BY MOUTH DAILY 90 tablet 0  . Dulaglutide (TRULICITY) 3.01 SW/1.0XN SOPN Inject 0.75 mg into the skin once a week. 0.75 mL 3  . levothyroxine (SYNTHROID) 100 MCG tablet TAKE ONE TABLET BY MOUTH EVERY MORNING BEFORE BREAKFAST 90 tablet PRN  . metFORMIN (GLUCOPHAGE) 500 MG tablet TAKE ONE TABLET BY MOUTH EVERY MORNING BEFORE BREAKFAST 30 tablet 11  . omeprazole (PRILOSEC) 20 MG capsule Take 1 capsule (20 mg total) by mouth daily. 30 capsule 1  . PARoxetine (PAXIL-CR) 25 MG 24 hr tablet Take 30 mg by mouth every morning.    . SUMAtriptan (IMITREX) 100 MG tablet 1 tablet as needed for migraines     No current facility-administered medications on file prior to visit.    Allergies  Allergen  Reactions  . Sulfonamide Derivatives     Family History  Problem Relation Age of Onset  . Hypertension Mother   . Hypertension Father   . Colon cancer Neg Hx   . Esophageal cancer Neg Hx   . Rectal cancer Neg Hx   . Stomach cancer Neg Hx     BP 100/78 (BP Location: Right Arm, Patient Position: Sitting, Cuff Size: Large)   Pulse 69   Ht 5\' 1"  (1.549 m)   Wt 180 lb 12.8 oz (82 kg)   LMP 04/25/2016   SpO2 97%   BMI 34.16 kg/m    Review of Systems Denies n/v, but she has regurgitation approx once/week.       Objective:   Physical Exam VITAL SIGNS:  See vs page GENERAL: no distress Pulses: dorsalis pedis intact bilat.   MSK: no deformity of the feet CV: trace bilat leg edema Skin:  no ulcer on the feet, but there are bilat calluses.  normal color and temp on  the feet. Neuro: sensation is intact to touch on the feet  Lab Results  Component Value Date   HGBA1C 5.1 12/17/2020       Assessment & Plan:  HTN: overcontrolled Type 2 DM: well-controlled Weight loss: check labs  Patient Instructions  Please continue the same diabetes 2 medications.   Please continue your weight loss efforts--these are working. Blood tests are requested for you today.  We'll let you know about the results.  I have sent a prescription to your pharmacy, to reduce the losartan.   check your blood sugar once a day.  vary the time of day when you check, between before the 3 meals, and at bedtime.  also check if you have symptoms of your blood sugar being too high or too low.  please keep a record of the readings and bring it to your next appointment here (or you can bring the meter itself).  You can write it on any piece of paper.  please call us sooner if your blood sugar goes below 70, or if you have a lot of readings over 200.   Please come back for a follow-up appointment in 1 year.

## 2020-12-17 NOTE — Patient Instructions (Addendum)
Please continue the same diabetes 2 medications.   Please continue your weight loss efforts--these are working. Blood tests are requested for you today.  We'll let you know about the results.  I have sent a prescription to your pharmacy, to reduce the losartan.   check your blood sugar once a day.  vary the time of day when you check, between before the 3 meals, and at bedtime.  also check if you have symptoms of your blood sugar being too high or too low.  please keep a record of the readings and bring it to your next appointment here (or you can bring the meter itself).  You can write it on any piece of paper.  please call us sooner if your blood sugar goes below 70, or if you have a lot of readings over 200.   Please come back for a follow-up appointment in 1 year.

## 2021-01-06 ENCOUNTER — Other Ambulatory Visit: Payer: Self-pay | Admitting: Endocrinology

## 2021-01-06 DIAGNOSIS — E119 Type 2 diabetes mellitus without complications: Secondary | ICD-10-CM

## 2021-02-26 ENCOUNTER — Institutional Professional Consult (permissible substitution): Payer: BC Managed Care – PPO | Admitting: Plastic Surgery

## 2021-03-10 IMAGING — MG MM DIGITAL DIAGNOSTIC UNILAT*L* W/ TOMO W/ CAD
4 series · 4 of 12 positions shown · non-contrast
Comparison: Previous exam(s).

CLINICAL DATA: Recall from screening mammography with
tomosynthesis, possible mass involving the LOWER subareolar LEFT
breast.

EXAM:
DIGITAL DIAGNOSTIC LEFT MAMMOGRAM WITH TOMO
ULTRASOUND LEFT BREAST

[L CC synth-2D]
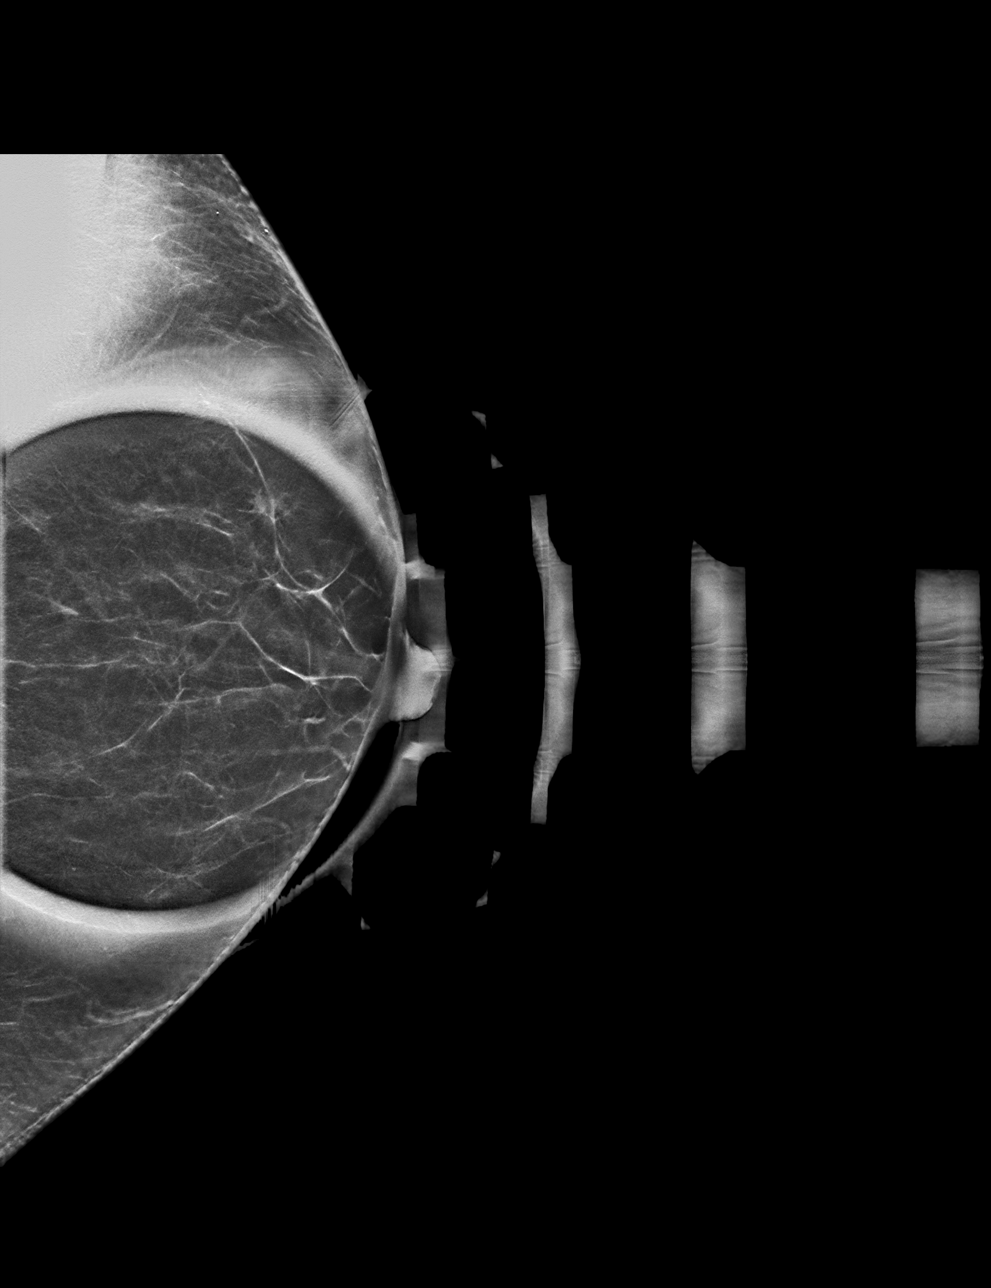

[L MLO synth-2D]
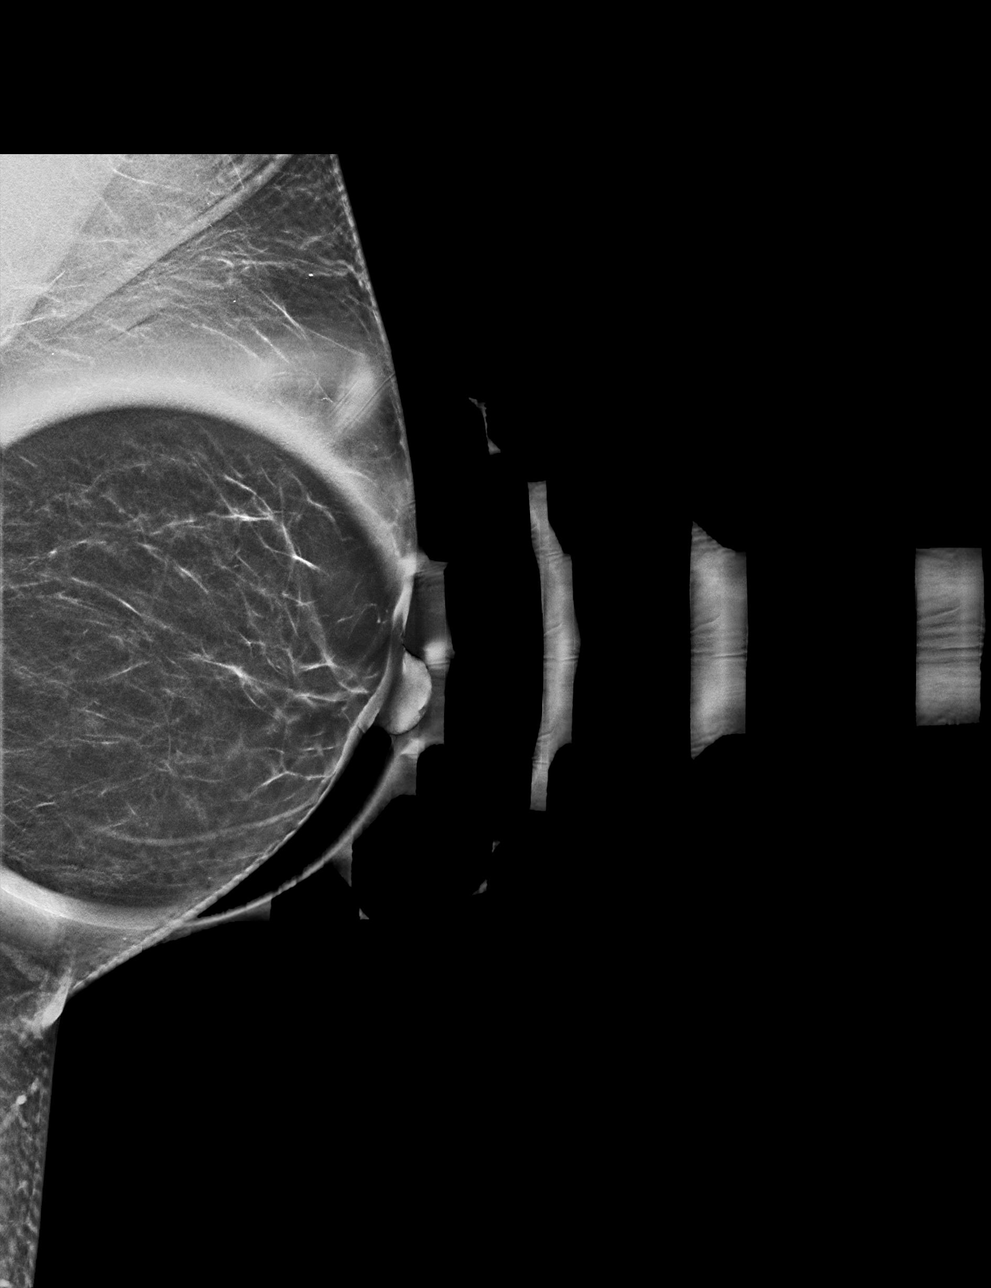

[L CC tomo · tomo slice 27/52.0]
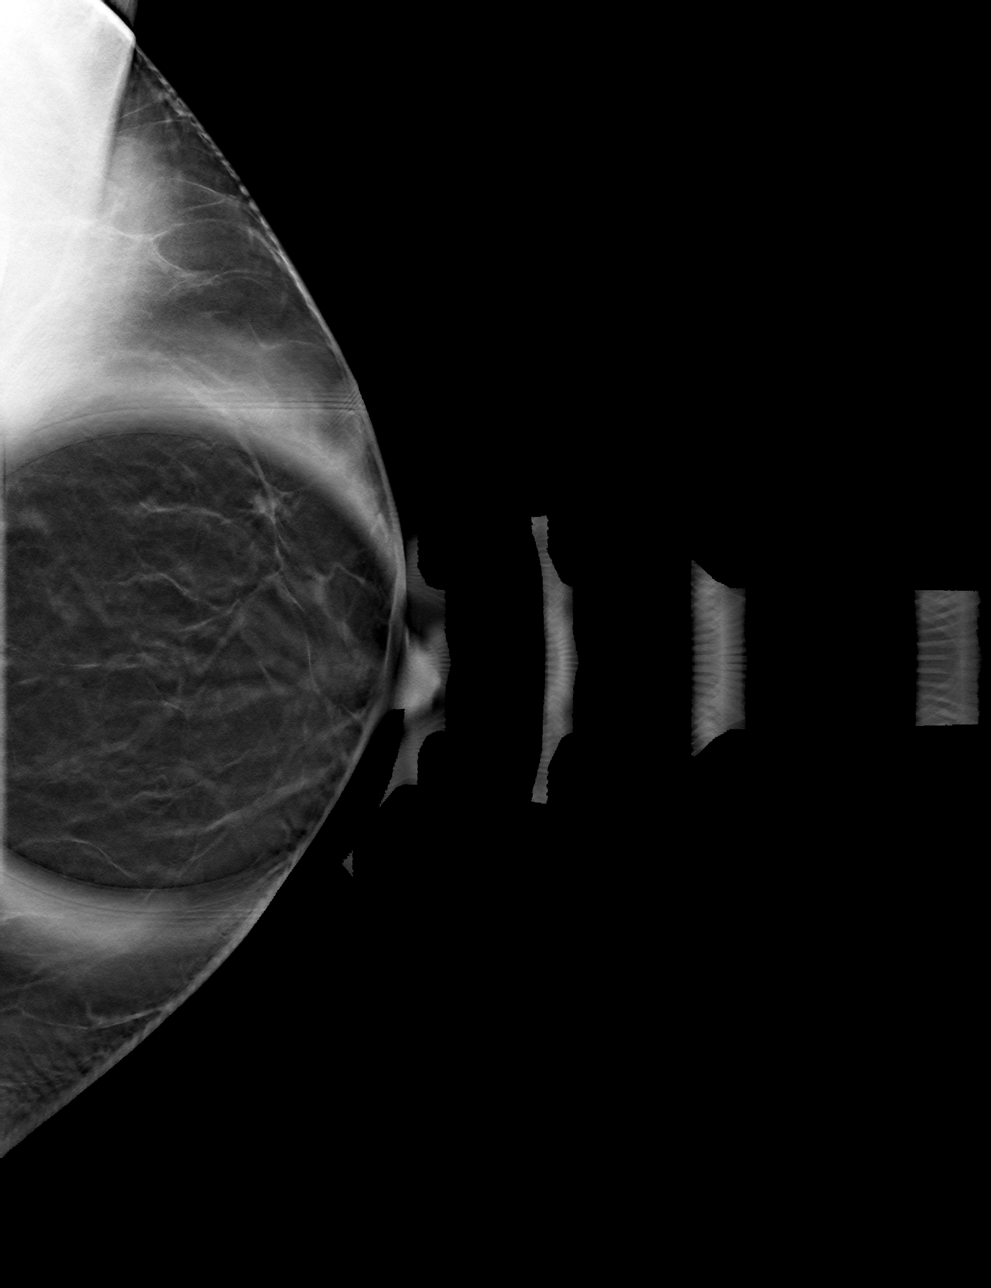

[L MLO tomo · tomo slice 27/53.0]
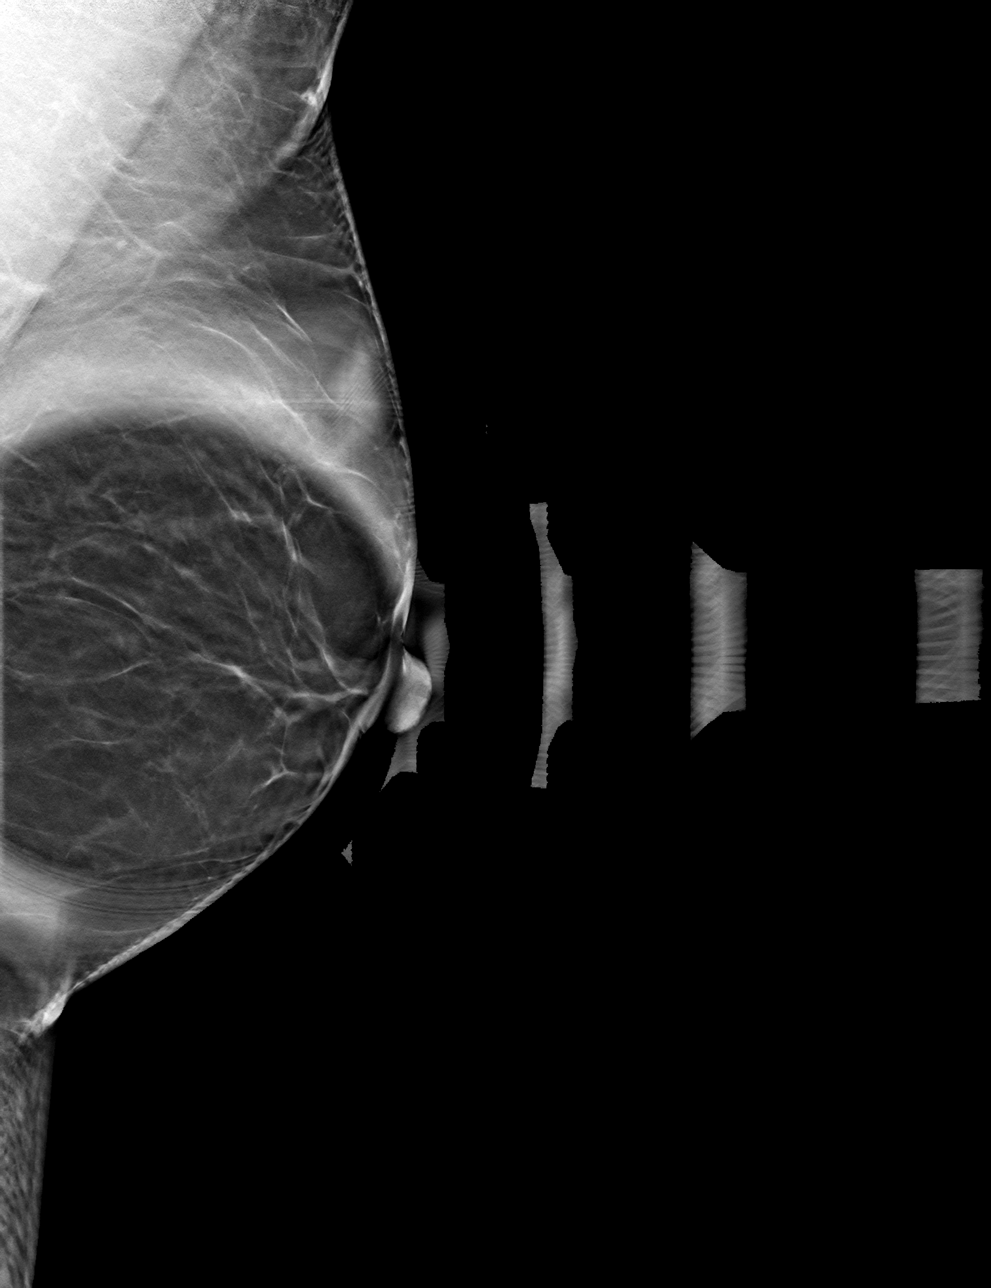

[4 of 12 positions shown; findings below may reference images not displayed]

ACR Breast Density Category b: There are scattered areas of
fibroglandular density.
FINDINGS: Tomosynthesis and synthesized spot-compression CC and MLO views of
the area of concern in the LEFT breast were obtained.

The possible mass in the LOWER subareolar location questioned on
screening mammography is inconspicuous on the spot compression
images, indicating that it is completely compressible. There is no
suspicious mass, architectural distortion or suspicious
calcifications.

Targeted ultrasound is performed, showing a circumscribed
hyperechoic focus within a subcutaneous fat lobule at the 4 o'clock
position approximately 1 cm from the nipple measuring approximately
6 x 5 x 5 mm, demonstrating no posterior characteristics and no
internal power Doppler flow, corresponding to the screening
mammographic mass. No suspicious solid mass or abnormal acoustic
shadowing is identified.
IMPRESSION: Benign 6 mm focus of fat necrosis involving a subcutaneous fat
lobule in the LOWER OUTER subareolar location of the LEFT breast
which accounts for the screening mammographic finding.

RECOMMENDATION:
Screening mammogram in one year.(Code:[id number]-A-KA9)

I have discussed the findings and recommendations with the patient.
If applicable, a reminder letter will be sent to the patient
regarding the next appointment.

BI-RADS CATEGORY  2: Benign.

## 2021-04-05 ENCOUNTER — Other Ambulatory Visit: Payer: Self-pay | Admitting: Endocrinology

## 2021-04-29 ENCOUNTER — Ambulatory Visit: Payer: BC Managed Care – PPO | Admitting: Plastic Surgery

## 2021-04-29 ENCOUNTER — Other Ambulatory Visit: Payer: Self-pay

## 2021-04-29 ENCOUNTER — Encounter: Payer: Self-pay | Admitting: Plastic Surgery

## 2021-04-29 VITALS — BP 113/78 | HR 69 | Ht 60.0 in | Wt 178.8 lb

## 2021-04-29 DIAGNOSIS — M793 Panniculitis, unspecified: Secondary | ICD-10-CM

## 2021-04-29 DIAGNOSIS — Z411 Encounter for cosmetic surgery: Secondary | ICD-10-CM | POA: Diagnosis not present

## 2021-04-29 NOTE — Progress Notes (Signed)
Referring Provider Center, [facility] [address] [place],  [place] [address]   CC:  Chief Complaint  Patient presents with   Advice Only      Jordan Holloway is an 55 y.o. female.  HPI: Patient presents to discuss body contouring after weight loss surgery.  She had a lap band in 2010 and lost at least 60 pounds.  Her weight has been stable for the past couple years.  She is bothered by overhanging abdominal skin that is causing skin irritation beneath the crease that have been refractory to over-the-counter treatments.  Previous surgeries include a laparoscopic gastric band procedure in 2 C-sections.  She is also going to be interested in some point in contouring of the arms and thighs but those are not her primary objective at this point.  She does not smoke and is not a diabetic.  Allergies  Allergen Reactions   Sulfonamide Derivatives     Outpatient Encounter Medications as of 04/29/2021  Medication Sig   ALPRAZolam (XANAX) 1 MG tablet Take 1 mg by mouth 3 (three) times daily as needed.   atorvastatin (LIPITOR) 10 MG tablet TAKE ONE TABLET BY MOUTH DAILY   levothyroxine (SYNTHROID) 100 MCG tablet TAKE ONE TABLET BY MOUTH EVERY MORNING BEFORE BREAKFAST   losartan (COZAAR) 25 MG tablet Take 1 tablet (25 mg total) by mouth daily.   metFORMIN (GLUCOPHAGE) 500 MG tablet TAKE ONE TABLET BY MOUTH EVERY MORNING BEFORE BREAKFAST   omeprazole (PRILOSEC) 40 MG capsule Take 40 mg by mouth daily.   PARoxetine (PAXIL-CR) 37.5 MG 24 hr tablet Take 37.5 mg by mouth daily.   traZODone (DESYREL) 50 MG tablet Take 100 mg by mouth at bedtime as needed.   TRULICITY [id number] SOPN INJECT 0.75 MG UNDER THE SKIN ONCE WEEKLY   [DISCONTINUED] ALPRAZolam (XANAX) 0.25 MG tablet Take 0.25 mg by mouth daily as needed.   [DISCONTINUED] omeprazole (PRILOSEC) 20 MG capsule Take 1 capsule (20 mg total) by mouth daily.   [DISCONTINUED] PARoxetine (PAXIL-CR) 25 MG 24 hr tablet Take 30 mg by mouth  every morning.   [DISCONTINUED] SUMAtriptan (IMITREX) 100 MG tablet 1 tablet as needed for migraines   No facility-administered encounter medications on file as of 04/29/2021.     Past Medical History:  Diagnosis Date   Anemia    Anxiety    ASTHMA 06/20/2007   Patient denies    DIABETES MELLITUS, TYPE II 06/20/2007   GERD 06/20/2007   Heart murmur    History of migraine headaches    HYPERCHOLESTEROLEMIA 01/05/2010   HYPERTENSION 11/15/2008   HYPOTHYROIDISM 01/26/2008   Polycystic ovary syndrome     Past Surgical History:  Procedure Laterality Date   Broken Clavicle     CESAREAN SECTION     2   Head Injury     KNEE SURGERY     LAPAROSCOPIC GASTRIC BANDING  05/13/2009   [facility] [place]    Family History  Problem Relation Age of Onset   Hypertension Mother    Hypertension Father    Colon cancer Neg Hx    Esophageal cancer Neg Hx    Rectal cancer Neg Hx    Stomach cancer Neg Hx     Social History   Social History Narrative   Not on file     Review of Systems General: Denies fevers, chills, weight loss CV: Denies chest pain, shortness of breath, palpitations  Physical Exam Vitals with BMI 04/29/2021 12/17/2020 12/13/2019  Height  $'5\' 0"'o$  '5\' 1"'$  '5\' 0"'$   Weight 178 lbs 13 oz 180 lbs 13 oz 239 lbs 6 oz  BMI 34.92 [id number]  Systolic 123456 [id number]  Diastolic 78 78 70  Pulse 69 69 76    General:  No acute distress,  Alert and oriented, Non-Toxic, Normal speech and affect Abdomen: Abdomen is soft nontender.  She has moderate to severe excess skin and subcutaneous tissue in the upper and lower abdomen.  Regarding scars she has lower C-section scars and laparoscopic port site scars.  I cannot feel her Lap-Band port in the upper abdomen.  I do not appreciate any obvious hernias.  Assessment/Plan Patient presents to discuss abdominal contouring after weight loss.  Think she is a good candidate for infraumbilical panniculectomy combined with upper abdominal  liposuction and plication.  I do believe this would give her the best aesthetic result.  I will have to check with our general surgery colleagues about how to handle the Lap-Band port.  That would be in the way of the plication and might be at risk with liposuction.  I went through the details of panniculectomy with the patient.  We discussed the risk that include bleeding, infection, damage to surrounding structures and need for additional procedures.  We discussed the location and orientation of the scars.  We discussed the expected recovery time.  We will plan to move forward.  Cindra Presume 04/29/2021, 5:31 PM

## 2021-06-12 ENCOUNTER — Telehealth: Payer: Self-pay | Admitting: Plastic Surgery

## 2021-06-12 NOTE — Telephone Encounter (Signed)
Called patient regarding insurance denial due to lack of documentation of a skin rash caused by excess skin. Explained that she has to have sufficient medical documentation that she has been treated for a rash. I explained that we would need a office visit(s) that documents she's actually been treated for a rash. Read the insurance denial to her as well. Patient stated understanding and would like to receive a quote for surgery in case insurance does not approve it when we appeal. She will contact us when she has completed at least 3 months of conservative treatment for a rash.

## 2021-07-02 ENCOUNTER — Other Ambulatory Visit: Payer: Self-pay | Admitting: Endocrinology

## 2021-07-02 DIAGNOSIS — E119 Type 2 diabetes mellitus without complications: Secondary | ICD-10-CM

## 2021-10-01 DIAGNOSIS — Z719 Counseling, unspecified: Secondary | ICD-10-CM

## 2021-10-19 ENCOUNTER — Other Ambulatory Visit: Payer: Self-pay | Admitting: Endocrinology

## 2021-10-26 ENCOUNTER — Telehealth: Payer: Self-pay | Admitting: Endocrinology

## 2021-10-26 NOTE — Telephone Encounter (Signed)
Message sent thru MyChart 

## 2021-10-26 NOTE — Telephone Encounter (Signed)
TRULICITY 8.65 HQ/4.6NG SOPN  Patient called request for Ozempic to replace the Trulicity. Please notify patient and advise. They can be reached [phone]

## 2021-10-31 ENCOUNTER — Other Ambulatory Visit: Payer: Self-pay | Admitting: Endocrinology

## 2021-10-31 DIAGNOSIS — E119 Type 2 diabetes mellitus without complications: Secondary | ICD-10-CM

## 2021-11-05 ENCOUNTER — Telehealth: Payer: Self-pay

## 2021-11-05 NOTE — Telephone Encounter (Signed)
Spoke with pt and she wanted to know if she could stop Trulicity and go with Ozempic bc it might increase her Wt Loss and wanted to know what you think about her decision.

## 2021-11-06 NOTE — Telephone Encounter (Signed)
Message sent to pt thru MyChart :  Stoping trulicity and going with Ozempic   I am fine with either one.  If you want, you could move up your next appointment to address

## 2021-11-10 ENCOUNTER — Telehealth: Payer: Self-pay

## 2021-11-10 NOTE — Telephone Encounter (Signed)
Please schedule pt for the next available appt per Loanne Drilling to discuss medication change.  Thank you,  Leamon Arnt

## 2021-11-11 ENCOUNTER — Other Ambulatory Visit: Payer: Self-pay

## 2021-11-11 ENCOUNTER — Ambulatory Visit: Payer: BC Managed Care – PPO | Admitting: Endocrinology

## 2021-11-11 VITALS — BP 122/80 | HR 65 | Ht 60.0 in | Wt 189.0 lb

## 2021-11-11 DIAGNOSIS — E119 Type 2 diabetes mellitus without complications: Secondary | ICD-10-CM

## 2021-11-11 LAB — POCT GLYCOSYLATED HEMOGLOBIN (HGB A1C): Hemoglobin A1C: 5.4 % (ref 4.0–5.6)

## 2021-11-11 MED ORDER — SEMAGLUTIDE (1 MG/DOSE) 4 MG/3ML ~~LOC~~ SOPN: 1.0000 mg | PEN_INJECTOR | SUBCUTANEOUS | 3 refills | Status: DC

## 2021-11-11 NOTE — Telephone Encounter (Signed)
Patient is scheduled for appointment 11/11/21 at 2:15 pm

## 2021-11-11 NOTE — Patient Instructions (Signed)
I have sent a prescription to your pharmacy, to change Trulicity to Ozempic Please continue the same metformin. Please come back for a follow-up appointment in 6-12 months.

## 2021-11-11 NOTE — Progress Notes (Signed)
Subjective:    Patient ID: Jordan Holloway, female    DOB: 07/17/1966, 56 y.o.   MRN: [id number]  HPI Pt returns for f/u of diabetes mellitus: DM type: 2 Dx'ed: 8338 Complications: none Therapy: Trulicity and metformin.   GDM: 2009 DKA: never. Severe hypoglycemia: never Pancreatitis: never. Other: she had gastric banding in 2011 (so she cannot take metformin-XR), but she says she cannot afford to go for f/u (but she is still well below her preop weight); she says ins declined additional surgery; She has never been on insulin, except during a pregnancy.   Interval history: pt states she feels well in general.  She takes meds as rx'ed, but she has trouble obtaining Trulicity.    Past Medical History:  Diagnosis Date   Anemia    Anxiety    ASTHMA 06/20/2007   Patient denies    DIABETES MELLITUS, TYPE II 06/20/2007   GERD 06/20/2007   Heart murmur    History of migraine headaches    HYPERCHOLESTEROLEMIA 01/05/2010   HYPERTENSION 11/15/2008   HYPOTHYROIDISM 01/26/2008   Polycystic ovary syndrome     Past Surgical History:  Procedure Laterality Date   Broken Clavicle     CESAREAN SECTION     2   Head Injury     KNEE SURGERY     LAPAROSCOPIC GASTRIC BANDING  05/13/2009   [facility] [place] [facility]    Social History   Socioeconomic History   Marital status: Married    Spouse name: Not on file   Number of children: Not on file   Years of education: Not on file   Highest education level: Not on file  Occupational History   Not on file  Tobacco Use   Smoking status: Former   Smokeless tobacco: Never   Tobacco comments:    Quit 26 years ago.  Vaping Use   Vaping Use: Never used  Substance and Sexual Activity   Alcohol use: Yes    Comment: rarely   Drug use: Not Currently   Sexual activity: Not on file  Other Topics Concern   Not on file  Social History Narrative   Not on file   Social Determinants of Health   Financial Resource Strain: Not on file   Food Insecurity: Not on file  Transportation Needs: Not on file  Physical Activity: Not on file  Stress: Not on file  Social Connections: Not on file  Intimate Partner Violence: Not on file    Current Outpatient Medications on File Prior to Visit  Medication Sig Dispense Refill   ALPRAZolam (XANAX) 1 MG tablet Take 1 mg by mouth 3 (three) times daily as needed.     atorvastatin (LIPITOR) 10 MG tablet TAKE ONE TABLET BY MOUTH DAILY 90 tablet 0   levothyroxine (SYNTHROID) 100 MCG tablet TAKE ONE TABLET BY MOUTH EVERY MORNING BEFORE BREAKFAST 90 tablet PRN   losartan (COZAAR) 25 MG tablet Take 1 tablet (25 mg total) by mouth daily. 90 tablet 3   metFORMIN (GLUCOPHAGE) 500 MG tablet TAKE ONE TABLET BY MOUTH EVERY MORNING BEFORE BREAKFAST 30 tablet 11   omeprazole (PRILOSEC) 40 MG capsule Take 40 mg by mouth daily.     PARoxetine (PAXIL-CR) 37.5 MG 24 hr tablet Take 37.5 mg by mouth daily.     traZODone (DESYREL) 50 MG tablet Take 100 mg by mouth at bedtime as needed.     No current facility-administered medications on file prior to visit.    Allergies  Allergen  Reactions   Sulfonamide Derivatives     Family History  Problem Relation Age of Onset   Hypertension Mother    Hypertension Father    Colon cancer Neg Hx    Esophageal cancer Neg Hx    Rectal cancer Neg Hx    Stomach cancer Neg Hx     BP 122/80    Pulse 65    Ht 5' (1.524 m)    Wt 189 lb (85.7 kg)    LMP 04/25/2016    SpO2 99%    BMI 36.91 kg/m    Review of Systems She has regained a few lbs.  Denies N/V/HB/bloating.      Objective:   Physical Exam VITAL SIGNS:  See vs page GENERAL: no distress    Lab Results  Component Value Date   HGBA1C 5.4 11/11/2021      Assessment & Plan:  Type 2 DM: well-controlled Obesity: uncontrolled  Patient Instructions  I have sent a prescription to your pharmacy, to change Trulicity to Ozempic Please continue the same metformin. Please come back for a follow-up  appointment in 6-12 months.

## 2021-12-10 ENCOUNTER — Encounter: Payer: Self-pay | Admitting: Physician Assistant

## 2021-12-10 ENCOUNTER — Ambulatory Visit: Payer: Self-pay | Admitting: Physician Assistant

## 2021-12-10 ENCOUNTER — Other Ambulatory Visit: Payer: Self-pay

## 2021-12-10 VITALS — BP 126/74 | HR 72 | Ht 60.0 in | Wt 186.0 lb

## 2021-12-10 DIAGNOSIS — M793 Panniculitis, unspecified: Secondary | ICD-10-CM

## 2021-12-10 DIAGNOSIS — Z719 Counseling, unspecified: Secondary | ICD-10-CM

## 2021-12-10 MED ORDER — HYDROCODONE-ACETAMINOPHEN 5-325 MG PO TABS: 1.0000 | ORAL_TABLET | Freq: Four times a day (QID) | ORAL | 0 refills | Status: AC | PRN

## 2021-12-10 MED ORDER — ONDANSETRON 4 MG PO TBDP: 4.0000 mg | ORAL_TABLET | Freq: Three times a day (TID) | ORAL | 0 refills | Status: DC | PRN

## 2021-12-10 NOTE — Progress Notes (Signed)
? ?  Patient ID: Jordan Holloway, female    DOB: [birth year], 56 y.o.   MRN: [id number] ? ?Chief Complaint  ?Patient presents with  ? Pre-op Exam  ? ? ?  ICD-10-CM   ?1. Panniculitis  M79.3   ?  ? ? ? ?History of Present Illness: ?Jordan Holloway is a 57 y.o.  female  with a history of large pannus.  She presents for preoperative evaluation for upcoming procedure, infraumbilical panniculectomy combined with upper abdominal liposuction and plication, scheduled for 12/28/2021 with Dr. Claudia Desanctis. ? ?The patient has not had problems with anesthesia.  Denies any personal or family history of blood clots clotting disorder.  No personal history of cancer.  No COPD or asthma.  She understands that she will need to meet with her general surgeon, Dr. Hassell Done, prior to surgery regarding her gastric band port.  She takes Xanax 3 times a day for anxiety.  We will hold day of surgery.  She we will also hold her multivitamin.  She states that she takes Excedrin intermittently for headache symptoms, but will abstain 7 days prior to surgery. ? ?Summary of Previous Visit: Patient was seen for initial consult on 04/29/2021 by Dr. Claudia Desanctis.  At that time, complained of overhanging excess abdominal skin subsequent to 60+ pound weight loss following gastric banding surgery in 2010.  Dr. Claudia Desanctis would confer with colleagues regarding her lap band port as that could potentially interfere with plication and liposuction of upper abdomen.  Patient expressed understanding of risks and benefits and was agreeable to plan. ? ?Job: Oncologist. ? ?PMH Significant for: Gastric band surgery 2010, multiple C-sections, type II DM A1c less than 6%, PCOS, anxiety, HTN, HLD. ? ? ?Past Medical History: ?Allergies: ?Allergies  ?Allergen Reactions  ? Sulfonamide Derivatives   ? Sulfa Antibiotics Rash  ? ? ?Current Medications: ? ?Current Outpatient Medications:  ?  ALPRAZolam (XANAX) 1 MG tablet, Take 1 mg by mouth 3 (three) times daily as needed., Disp: ,  Rfl:  ?  atorvastatin (LIPITOR) 10 MG tablet, TAKE ONE TABLET BY MOUTH DAILY, Disp: 90 tablet, Rfl: 0 ?  levothyroxine (SYNTHROID) 100 MCG tablet, TAKE ONE TABLET BY MOUTH EVERY MORNING BEFORE BREAKFAST, Disp: 90 tablet, Rfl: PRN ?  losartan (COZAAR) 25 MG tablet, Take 1 tablet (25 mg total) by mouth daily., Disp: 90 tablet, Rfl: 3 ?  metFORMIN (GLUCOPHAGE) 500 MG tablet, TAKE ONE TABLET BY MOUTH EVERY MORNING BEFORE BREAKFAST, Disp: 30 tablet, Rfl: 11 ?  omeprazole (PRILOSEC) 40 MG capsule, Take 40 mg by mouth daily., Disp: , Rfl:  ?  PARoxetine (PAXIL-CR) 37.5 MG 24 hr tablet, Take 37.5 mg by mouth daily., Disp: , Rfl:  ?  Semaglutide, 1 MG/DOSE, 4 MG/3ML SOPN, Inject 1 mg as directed once a week., Disp: 3 mL, Rfl: 3 ? ?Past Medical Problems: ?Past Medical History:  ?Diagnosis Date  ? Anemia   ? Anxiety   ? ASTHMA 06/20/2007  ? Patient denies   ? DIABETES MELLITUS, TYPE II 06/20/2007  ? GERD 06/20/2007  ? Heart murmur   ? History of migraine headaches   ? HYPERCHOLESTEROLEMIA 01/05/2010  ? HYPERTENSION 11/15/2008  ? HYPOTHYROIDISM 01/26/2008  ? Polycystic ovary syndrome   ? ? ?Past Surgical History: ?Past Surgical History:  ?Procedure Laterality Date  ? Broken Clavicle    ? CESAREAN SECTION    ? 2  ? Head Injury    ? KNEE SURGERY    ? LAPAROSCOPIC GASTRIC BANDING  05/13/2009  ?  Dr. Karle [facility]  ? ? ?Social History: ?Social History  ? ?Socioeconomic History  ? Marital status: Married  ?  Spouse name: Not on file  ? Number of children: Not on file  ? Years of education: Not on file  ? Highest education level: Not on file  ?Occupational History  ? Not on file  ?Tobacco Use  ? Smoking status: Former  ? Smokeless tobacco: Never  ? Tobacco comments:  ?  Quit 26 years ago.  ?Vaping Use  ? Vaping Use: Never used  ?Substance and Sexual Activity  ? Alcohol use: Yes  ?  Comment: rarely  ? Drug use: Not Currently  ? Sexual activity: Not on file  ?Other Topics Concern  ? Not on file  ?Social History Narrative  ? Not  on file  ? ?Social Determinants of Health  ? ?Financial Resource Strain: Not on file  ?Food Insecurity: Not on file  ?Transportation Needs: Not on file  ?Physical Activity: Not on file  ?Stress: Not on file  ?Social Connections: Not on file  ?Intimate Partner Violence: Not on file  ? ? ?Family History: ?Family History  ?Problem Relation Age of Onset  ? Hypertension Mother   ? Hypertension Father   ? Colon cancer Neg Hx   ? Esophageal cancer Neg Hx   ? Rectal cancer Neg Hx   ? Stomach cancer Neg Hx   ? ? ?Review of Systems: ?ROS ?Denies recent fevers or infection, chest pain or difficulty breathing, trauma. ? ?Physical Exam: ?Vital Signs ?BP 126/74 (BP Location: Left Arm, Patient Position: Sitting, Cuff Size: Large)   Pulse 72   Ht 5' (1.524 m)   Wt 186 lb (84.4 kg)   LMP 04/25/2016   SpO2 96%   BMI 36.33 kg/m?  ? ?Physical Exam ?Constitutional:   ?   General: Not in acute distress. ?   Appearance: Normal appearance. Not ill-appearing.  ?HENT:  ?   Head: Normocephalic and atraumatic.  ?Eyes:  ?   Pupils: Pupils are equal, round. ?Cardiovascular:  ?   Rate and Rhythm: Normal rate. ?   Pulses: Normal pulses.  ?Pulmonary:  ?   Effort: No respiratory distress or increased work of breathing.  Speaks in full sentences. ?Abdominal:  ?   General: Abdomen is flat. No distension.   ?Musculoskeletal: Normal range of motion. No lower extremity swelling or edema. ?Skin: ?   General: Skin is warm and dry.  ?   Findings: No erythema or rash.  ?Neurological:  ?   Mental Status: Alert and oriented to person, place, and time.  ?Psychiatric:     ?   Mood and Affect: Mood normal.     ?   Behavior: Behavior normal.  ? ? ?Assessment/Plan: ?The patient is scheduled for infraumbilical panniculectomy combined with upper abdominal liposuction and plication with Dr. Claudia Desanctis.  Risks, benefits, and alternatives of procedure discussed, questions answered and consent obtained.   ? ?Smoking Status: Non-smoker ? ?Caprini Score: 5; Risk Factors  include: Age, BMI greater than 25, and length of planned surgery. Recommendation for mechanical prophylaxis. Encourage early ambulation.  ? ?Pictures obtained: 04/29/2021 ? ?Post-op Rx sent to pharmacy: Norco, Zofran. ? ?Patient was provided with the General Surgical Risk consent document and Pain Medication Agreement prior to their appointment.  They had adequate time to read through the risk consent documents and Pain Medication Agreement. We also discussed them in person together during this preop appointment. All of their questions were answered to  their satisfaction.  Recommended calling if they have any further questions.  Risk consent form and Pain Medication Agreement to be scanned into patient's chart. ? ?The risk that can be encountered for this procedure were discussed and include the following but not limited to these: asymmetry, fluid accumulation, firmness of the tissue, skin loss, decrease or no sensation, fat necrosis, bleeding, infection, healing delay.  Deep vein thrombosis, cardiac and pulmonary complications are risks to any procedure.  There are risks of anesthesia, changes to skin sensation and injury to nerves or blood vessels.  The muscle can be temporarily or permanently injured.  You may have an allergic reaction to tape, suture, glue, blood products which can result in skin discoloration, swelling, pain, skin lesions, poor healing.  Any of these can lead to the need for revisonal surgery or stage procedures.  Weight gain and weigh loss can also effect the long term appearance. The results are not guaranteed to last a lifetime.  Future surgery may be required.   ? ? ?Electronically signed by: Krista Blue, PA-C 12/10/2021 4:04 PM ?

## 2021-12-14 ENCOUNTER — Telehealth: Payer: Self-pay

## 2021-12-14 NOTE — Telephone Encounter (Signed)
Patient called in stating that she has surgery on 3/27 and is supposed to meet with Dr.Martin before then, the only appointment they have is 4 days prior to her surgery. Wanting to know if this is okay or is there a way she can get worked in sooner.  ?

## 2021-12-14 NOTE — Telephone Encounter (Signed)
Spoke with Dr. Claudia Desanctis - He states he would need for her to meet with Dr. Hassell Done this week in regards to her port. The port is currently in the location where Dr. Claudia Desanctis would be doing the plication. He needs to know if Dr. Hassell Done needs to go in and move it to another location or just remove it. She will call Dr. Earlie Server office and try to get an earlier appointment.  ?

## 2021-12-17 ENCOUNTER — Ambulatory Visit: Payer: BC Managed Care – PPO | Admitting: Endocrinology

## 2021-12-28 ENCOUNTER — Other Ambulatory Visit: Payer: Self-pay | Admitting: Plastic Surgery

## 2021-12-28 DIAGNOSIS — M793 Panniculitis, unspecified: Secondary | ICD-10-CM

## 2022-01-01 ENCOUNTER — Other Ambulatory Visit: Payer: Self-pay | Admitting: Endocrinology

## 2022-01-06 ENCOUNTER — Other Ambulatory Visit: Payer: Self-pay

## 2022-01-06 ENCOUNTER — Ambulatory Visit (INDEPENDENT_AMBULATORY_CARE_PROVIDER_SITE_OTHER): Payer: BC Managed Care – PPO | Admitting: Plastic Surgery

## 2022-01-06 DIAGNOSIS — M793 Panniculitis, unspecified: Secondary | ICD-10-CM

## 2022-01-06 NOTE — Progress Notes (Signed)
Patient presents postop from abdominoplasty.  She feels like things are going well.  She reports about 20 cc a day of drain output over the last 3 to 4 days.  On exam everything looks to be doing well.  Incision is intact.  Mild edema and bruising from the liposuction.  Umbilicus viable.  Drain was removed.  We reviewed continuing to avoid strenuous activity and using compressive garments.  All of her questions were answered we will see her again in 2 weeks. ?

## 2022-01-14 ENCOUNTER — Other Ambulatory Visit: Payer: Self-pay | Admitting: Endocrinology

## 2022-01-15 NOTE — Progress Notes (Signed)
Patient is a 56 year old female with PMH of panniculitis s/p panniculectomy with upper abdominal liposuction and plication performed 8/41/3244 by Dr. Claudia Desanctis who presents to clinic for postoperative follow-up. ? ?Patient was seen here in clinic for her initial postop visit on 01/06/2022.  At that time, exam was reassuring and drain output had been slowing.  Drain was removed without complication.  Mild edema and bruising was noted from the liposuction.  Discussed continued compressive garments and activity modifications. ? ?Today, patient is doing well.  She does have a couple of concerns.  She tells me that she has noticed an asymmetry with the left side of her abdomen appearing to be more projected and that the bellybutton consequently appears deviated to the right side.  Additionally, she has areas of firmness around the bellybutton as well as over the mons pubis.  Patient has not applied any Vaseline or other topical agents.  She denies any significant redness, pain, drainage, or other symptoms.  She states that overall she is pleased.   ? ?Physical exam is largely reassuring.  There does appear to be a mild asymmetry with the bellybutton seemingly deviated to the right side.  There is mild areas of firmness around the umbilicus, likely fat necrosis.  The mons pubis is also mildly firm which I told patient is common after this surgery and will soften.  Her drain tube insertion site as well as scattered areas along the panniculectomy repair site appear dry.  Mild irritation surrounding, but no cellulitic changes.  Umbilicus appears viable. ? ?Recommending Vaseline 1-2 times daily over the dry areas, as needed.  I also encouraged her to gently massage the firm areas around the bellybutton as well as mons area.  Asked that she return in 3 weeks for likely final postoperative encounter.  At that time, we will discuss scar creams.  Hopefully her asymmetry is related to residual swelling, but if not, we will discuss  further.  We will also plan to obtain photos at that time.   ?

## 2022-01-20 ENCOUNTER — Encounter: Payer: Self-pay | Admitting: Physician Assistant

## 2022-01-20 ENCOUNTER — Ambulatory Visit (INDEPENDENT_AMBULATORY_CARE_PROVIDER_SITE_OTHER): Payer: BC Managed Care – PPO | Admitting: Physician Assistant

## 2022-01-20 DIAGNOSIS — Z9889 Other specified postprocedural states: Secondary | ICD-10-CM

## 2022-02-15 ENCOUNTER — Ambulatory Visit (INDEPENDENT_AMBULATORY_CARE_PROVIDER_SITE_OTHER): Payer: BC Managed Care – PPO | Admitting: Physician Assistant

## 2022-02-15 DIAGNOSIS — Z9889 Other specified postprocedural states: Secondary | ICD-10-CM

## 2022-02-15 NOTE — Progress Notes (Signed)
Patient is a 56 year old female with PMH of panniculitis s/p panniculectomy with upper abdominal liposuction and plication performed 0/07/9322 by Dr. Claudia Desanctis who presents to clinic for postoperative follow-up. ? ?She was last seen here in clinic on 01/20/2022.  At that time, exam was entirely reassuring.  The bellybutton did appear to be mildly deviated to the right side.  There is also periumbilical firmness concerning for fat necrosis.  However, remainder of exam was otherwise reassuring.  Plan was for Vaseline 1-2 times daily over the dry areas as needed as well as gentle massage of the firm periumbilical areas.  Return in 3-4 weeks for check-up and to discuss scar creams.   ? ?Today, patient is doing well.  She has been continue to apply Aquaphor to her abdominal incision as needed for dryness.  She has continued to wear her compressive binder at night when sleeping and her slightly less compressive garments/spanks during the day.  Overall she is pleased.  Denies any wounds, drainage, redness, or any new/worsening swelling.   ? ?Physical exam largely reassuring.  Her panniculectomy incision is healed nicely.  Mildly dry medially, but no wounds or drainage appreciated.  There is a very small dogear on the right about abdomen.  Bellybutton appears viable.  Persistent areas of firmness periumbilically consistent with possible fat necrosis.  Bellybutton remains slightly deviated to the right side of abdomen. ? ?Patient says that she would like to have the tiny dogear corner on right side of abdomen trimmed if possible here in clinic.  She also inquires about ways to possibly address her bellybutton position.  Instructed patient to continue gently massaging the firm areas periumbilically to see if that helps address the asymmetry.  Informed patient that it can take 6 months for fat necrosis to soften/liquefy.  We will have patient plan for 46-monthfollow-up consult with Dr. PClaudia Desanctisto discuss possible dogear correction as  well as discuss options with regard to umbilicus position. ? ?Picture(s) obtained of the patient and placed in the chart were with the patient's or guardian's permission. ? ?

## 2022-04-09 ENCOUNTER — Telehealth: Payer: Self-pay

## 2022-04-09 NOTE — Telephone Encounter (Signed)
Spoke with the patient in regards to scheduling a follow up visit with another provider here at the practice, she has now been scheduled. Patient preferred to be scheduled 12 mos out as advised by Loanne Drilling. Says that she will schedule sooner if needed.

## 2022-04-12 ENCOUNTER — Other Ambulatory Visit: Payer: Self-pay

## 2022-04-12 DIAGNOSIS — E119 Type 2 diabetes mellitus without complications: Secondary | ICD-10-CM

## 2022-04-12 MED ORDER — METFORMIN HCL 500 MG PO TABS: 500.0000 mg | ORAL_TABLET | Freq: Every day | ORAL | 11 refills | Status: DC

## 2022-04-22 ENCOUNTER — Other Ambulatory Visit: Payer: Self-pay

## 2022-04-22 MED ORDER — LOSARTAN POTASSIUM 25 MG PO TABS: 25.0000 mg | ORAL_TABLET | Freq: Every day | ORAL | 0 refills | Status: DC

## 2022-06-29 ENCOUNTER — Ambulatory Visit: Payer: BC Managed Care – PPO | Admitting: Physician Assistant

## 2022-06-30 ENCOUNTER — Ambulatory Visit: Payer: BC Managed Care – PPO | Admitting: Student

## 2022-06-30 ENCOUNTER — Ambulatory Visit: Payer: BC Managed Care – PPO | Admitting: Plastic Surgery

## 2022-07-01 ENCOUNTER — Ambulatory Visit: Payer: BC Managed Care – PPO | Admitting: Student

## 2022-07-01 DIAGNOSIS — M7989 Other specified soft tissue disorders: Secondary | ICD-10-CM | POA: Diagnosis not present

## 2022-07-01 DIAGNOSIS — Z9889 Other specified postprocedural states: Secondary | ICD-10-CM

## 2022-07-01 NOTE — Progress Notes (Signed)
   Referring Provider Center, [facility] [address] [place],  [place] [address]   CC:  Chief Complaint  Patient presents with   Follow-up      Jordan Holloway is an 56 y.o. female.  HPI:       Patient is a 56 year old female with history of panniculitis status post panniculectomy with after abdominal liposuction and plication performed on 12/28/2021 by Dr. Claudia Desanctis.  Patient presents to the clinic today to discuss possible revision from panniculectomy.  Patient was last seen in the clinic on 02/15/2022.  At this visit, she was doing well.  On exam, her panniculectomy incision had healed well.  There was some dryness medially, but there is no wounds or drainage noted.  There is a small dogear on the right aspect of the abdomen.  The bellybutton was deviated slightly to the right side of the abdomen.  There was some periumbilical firmness that was consistent with fat necrosis.    Patient at this visit expressed that she would like to have the tiny dogear corner on the right side of the abdomen trimmed in the clinic, and inquired about addressing her bellybutton position.  Patient was instructed to follow-up in 6 months to discuss possible dogear correction and discuss options for the umbilicus positioning.  Today, patient reports she is doing well.  She states that she is still bothered by the positioning of her bellybutton.  Patient also states she feels that the left side of her upper abdomen protrudes more than her right side.  She is also bothered by the small dogear to her right lateral incision.  Patient has no other concerns or complaints at this time.  She denies any changes in her health or changes since she was last seen in the clinic.  Review of Systems General: Denies recent changes in health  Physical Exam    12/10/2021    3:11 PM 11/11/2021    2:06 PM 04/29/2021    1:30 PM  Vitals with BMI  Height '5\' 0"'$  '5\' 0"'$  '5\' 0"'$   Weight 186 lbs 189 lbs 178 lbs 13 oz  BMI 36.33 27.03  50.09  Systolic 381 829 937  Diastolic 74 80 78  Pulse 72 65 69    General:  No acute distress,  Alert and oriented, Non-Toxic, Normal speech and affect Chaperone present on exam.  On exam, patient is sitting upright in no acute distress.  Abdomen is soft and nontender.  There is no overlying erythema or ecchymosis.  Panniculectomy incision appears to have healed well.  There is a very small dogear noted to the right lateral aspect of the incision.  Umbilicus appears slightly deviated to the right.  Small area of firmness near the umbilicus consistent with either fat necrosis versus the lap band port site.   Assessment/Plan  S/P panniculectomy   I discussed with the patient that she should continue to gently massage the area near her umbilicus.   I discussed with the patient that we will have her follow-up with Dr. Marla Roe to discuss if any further procedures can be done to help fix the areas that the patient is bothered with.  Patient agreed with plan.  I instructed the patient to call if she has any other questions or concerns.  Patient to follow-up with Dr. Marla Roe.  Pictures were obtained of the patient and placed in the chart with the patient's or guardian's permission.   Clance Boll 07/01/2022, 10:21 AM

## 2022-07-29 ENCOUNTER — Other Ambulatory Visit: Payer: Self-pay | Admitting: Endocrinology

## 2022-08-10 ENCOUNTER — Encounter: Payer: Self-pay | Admitting: Plastic Surgery

## 2022-08-10 ENCOUNTER — Ambulatory Visit (INDEPENDENT_AMBULATORY_CARE_PROVIDER_SITE_OTHER): Payer: BC Managed Care – PPO | Admitting: Plastic Surgery

## 2022-08-10 VITALS — BP 134/83 | HR 71 | Ht 60.0 in | Wt 188.0 lb

## 2022-08-10 DIAGNOSIS — Z9884 Bariatric surgery status: Secondary | ICD-10-CM

## 2022-08-10 DIAGNOSIS — L7682 Other postprocedural complications of skin and subcutaneous tissue: Secondary | ICD-10-CM | POA: Diagnosis not present

## 2022-08-10 DIAGNOSIS — Z719 Counseling, unspecified: Secondary | ICD-10-CM | POA: Insufficient documentation

## 2022-08-10 NOTE — Progress Notes (Signed)
Patient ID: Jordan Holloway, female    DOB: Dec 10, 1965, 56 y.o.   MRN: [id number]   Chief Complaint  Patient presents with   Advice Only    The patient is a 56 year old female here for evaluation of her abdomen.  She was seen by Dr. Claudia Desanctis in July 2022 and underwent a panniculectomy and add on abdominoplasty.  She is not happy with her dogears and abdominal asymmetry particularly worse on the left side.  She has had previous surgery including a gastric band and 2 C-sections.  Her past medical history does include diabetes and thyroid disease.  I can appreciate her asymmetry and the dogears.  I think it is too much to do here in the office.  Her abdominal wall feels strong and intact without any palpable hernia.    Review of Systems  Constitutional: Negative.   Eyes: Negative.   Cardiovascular: Negative.   Gastrointestinal: Negative.   Endocrine: Negative.   Genitourinary: Negative.   Musculoskeletal: Negative.     Past Medical History:  Diagnosis Date   Anemia    Anxiety    ASTHMA 06/20/2007   Patient denies    DIABETES MELLITUS, TYPE II 06/20/2007   GERD 06/20/2007   Heart murmur    History of migraine headaches    HYPERCHOLESTEROLEMIA 01/05/2010   HYPERTENSION 11/15/2008   HYPOTHYROIDISM 01/26/2008   Polycystic ovary syndrome     Past Surgical History:  Procedure Laterality Date   Broken Clavicle     CESAREAN SECTION     2   Head Injury     KNEE SURGERY     LAPAROSCOPIC GASTRIC BANDING  05/13/2009   Dr. [facility]      Current Outpatient Medications:    ALPRAZolam (XANAX) 1 MG tablet, Take 1 mg by mouth 3 (three) times daily as needed., Disp: , Rfl:    atorvastatin (LIPITOR) 10 MG tablet, TAKE ONE TABLET BY MOUTH DAILY, Disp: 90 tablet, Rfl: 0   levothyroxine (SYNTHROID) 100 MCG tablet, TAKE ONE TABLET BY MOUTH EVERY MORNING BEFORE BREAKFAST, Disp: 90 tablet, Rfl: PRN   losartan (COZAAR) 25 MG tablet, TAKE 1 TABLET BY MOUTH DAILY, Disp: 90  tablet, Rfl: 0   metFORMIN (GLUCOPHAGE) 500 MG tablet, Take 1 tablet (500 mg total) by mouth daily with breakfast., Disp: 30 tablet, Rfl: 11   omeprazole (PRILOSEC) 40 MG capsule, Take 40 mg by mouth daily., Disp: , Rfl:    ondansetron (ZOFRAN-ODT) 4 MG disintegrating tablet, Take 1 tablet (4 mg total) by mouth every 8 (eight) hours as needed for nausea or vomiting., Disp: 20 tablet, Rfl: 0   PARoxetine (PAXIL-CR) 37.5 MG 24 hr tablet, Take 37.5 mg by mouth daily., Disp: , Rfl:    Semaglutide, 1 MG/DOSE, 4 MG/3ML SOPN, Inject 1 mg as directed once a week., Disp: 3 mL, Rfl: 3   Objective:   Vitals:   08/10/22 0921  BP: 134/83  Pulse: 71  SpO2: 97%    Physical Exam Constitutional:      Appearance: Normal appearance.  HENT:     Head: Normocephalic.  Cardiovascular:     Rate and Rhythm: Normal rate.     Pulses: Normal pulses.  Pulmonary:     Effort: Pulmonary effort is normal.  Abdominal:     Palpations: Abdomen is soft.  Skin:    General: Skin is warm.     Coloration: Skin is not jaundiced.     Findings: No bruising, erythema or lesion.  Neurological:     Mental Status: She is alert and oriented to person, place, and time.  Psychiatric:        Mood and Affect: Mood normal.        Behavior: Behavior normal.        Thought Content: Thought content normal.        Judgment: Judgment normal.     Assessment & Plan:  Encounter for counseling  The patient has reasonable expectations for improved symmetry.  I think she would be best served with liposuction of the abdomen to try and even out the asymmetry of the fullness she has on the left side.  I would then suggest liposuction on the lateral side with excision of the dogears.  The patient is in agreement and we will see if we can work out either her insurance or a quote.  Pictures were obtained of the patient and placed in the chart with the patient's or guardian's permission.   Pictures were obtained of the patient and placed  in the chart with the patient's or guardian's permission.   [place], DO

## 2022-08-13 ENCOUNTER — Telehealth: Payer: Self-pay | Admitting: Plastic Surgery

## 2022-08-13 NOTE — Telephone Encounter (Signed)
FAX to DR. [name] regarding meeting about revision - not happy with request from MD to delay copy of letter to provider

## 2022-08-13 NOTE — Telephone Encounter (Signed)
TRLVM Regarding surgery from dr. Claudia Desanctis.  [name] did consult

## 2022-08-16 ENCOUNTER — Telehealth: Payer: Self-pay | Admitting: Plastic Surgery

## 2022-08-16 NOTE — Telephone Encounter (Signed)
Spoke with patient about revision surgery.  I told patient the conversation about weight loss with dr. Marla Roe was not a request to do so - but if she was going to do so - it would have a better outcome prior to surgery - she said that made sense.  I advised that Dr. Marla Roe felt that since Dr. Claudia Desanctis will remain in [place] and she was unhappy with surgery and wanted correction that since we have no openings for over 4 months I would contact her at the point Dr. Claudia Desanctis did open his new practice - she was good with that as she has to wait either way.

## 2022-08-30 ENCOUNTER — Telehealth: Payer: Self-pay | Admitting: Physician Assistant

## 2022-08-30 ENCOUNTER — Telehealth: Payer: Self-pay | Admitting: *Deleted

## 2022-08-30 NOTE — Telephone Encounter (Signed)
Patient called in and requested to speak to PA or scheduler.   I spoke with Ms. Saffran who was questioning IF Dr. Claudia Desanctis would actually have a practice opening and how do we know that ect. Reassured her that the last person she spoke to about it was Lattie Haw (not Olivia Mackie) the Psychiatrist.   Listened to her complaints with empathy and reassured her that I would give the message to [facility] once she returned from [place].

## 2022-08-30 NOTE — Telephone Encounter (Signed)
Please call back asap regarding dog ears and uneven belly button and liposuction.

## 2022-09-01 ENCOUNTER — Telehealth: Payer: Self-pay | Admitting: Plastic Surgery

## 2022-09-01 NOTE — Telephone Encounter (Signed)
Patient called back Monday and left message that she thought we should cover cost - that Dr. Claudia Desanctis never explained possible complications.  Her statement was she does not want to be a ping pong ball.  I just reached patient again this afternoon and explained that if Dr. Claudia Desanctis had moved out of state and a good distance away,  and there was no physical way for the patient to easily access him for the revision - our practice would do what we could.--- again original plan was to run the procedure through insurance.    That is not the case with Dr. Claudia Desanctis - he is staying local and was either going in to practice himself or with another surgical practice.  I advised that the surgeon who performed the surgery should be the responsible party to make the revisions if she was not opposed and unhappy with the original results, as he would have done them here, if still with our practice.    Patient issue seems to be more about being charged for the correction than the person doing the surgery.  I advised that original Dr. Claudia Desanctis do the surgery----- dillingham has no surgery time available until March still.  I will contact patient when we are aware [facility] opens practice.  All her documentation - photos  notations and visits will be available to Dr. Claudia Desanctis

## 2022-09-17 ENCOUNTER — Institutional Professional Consult (permissible substitution): Payer: BC Managed Care – PPO | Admitting: Plastic Surgery

## 2022-10-19 ENCOUNTER — Telehealth: Payer: Self-pay | Admitting: Plastic Surgery

## 2022-10-19 NOTE — Telephone Encounter (Signed)
Called ptn to advise dr. Claudia Desanctis is preparing to go back in to business and I had promised I would let her know,  she also said a charge was dropped for no no charge consult to discuss revision -  sent message to Margot Ables in billing asked for reversal

## 2022-10-22 ENCOUNTER — Telehealth: Payer: Self-pay | Admitting: Physician Assistant

## 2022-10-22 NOTE — Telephone Encounter (Signed)
Pt was a previous [facility] pt and would like to discuss with you some issues.

## 2022-10-25 NOTE — Telephone Encounter (Signed)
Spoke with patient. 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  Can we please provide her with a quote for these services.  She reports that she is still making payments on the liposuction that was provided at the time of her panniculectomy.  Patient also will plan to connect with Dr. Claudia Desanctis once he opens, but would like to have a more transparent understanding of what it would cost if she were to have it performed by Dr. Janalyn Shy. Thanks

## 2022-10-26 ENCOUNTER — Telehealth: Payer: Self-pay | Admitting: Plastic Surgery

## 2022-10-26 ENCOUNTER — Ambulatory Visit: Payer: BC Managed Care – PPO | Admitting: Plastic Surgery

## 2022-10-26 NOTE — Telephone Encounter (Signed)
Safety zone submitted for grievance about performing corrective surgery at no charge   Patient had surgery with Dr. Claudia Desanctis 03.27.23. In post op appts she stated she was unhappy in April and May with symmetry, abd. dog ears and location of belly button. She was advised 05.15.23 by PA Krista Blue to wait 6 months and come back to discuss revision with Dr. Claudia Desanctis. Dr. Claudia Desanctis left [facility] in July. She returned in Sept met with [facility] PA and was referred to Dr. Marla Roe on 11.7.61. Dr. Marla Roe discussed revision - 2 days later a fax was sent to Dr, Marla Roe that challenged their discussion about weight loss and the clinics responsibility to fix this surgery. Dr. Marla Roe said she did not have any desire at that point to do a revision for the patient as she had chastised her in the fax that it was her responsibility. I contacted patient and advised the conversation with Dr. Marla Roe was documented differently and advised patient that Dr. Marla Roe had suggested since Dr. Claudia Desanctis performed the surgery he was aware of the discussion about revision -- perhaps she would like to wait for him to go back in to practice - as he is still located in [place] with surgical priviledges. She agreed. I contacted patient in January to advise Dr. Claudia Desanctis had set up a clinic and website and looked to be close to being back in practice, Gave her contact information - she contacted Krista Blue PA in my office on 01.22.24 - and said she felt like we had as a clinic pushed her away - and she had no idea if Dr. Claudia Desanctis would fix the problem. I'm asking that this be reviewed as a Tourist information centre manager. This surgeon is no longer with the practice - the Medical director here because of the tone of her fax has no desire to do a revision and certainly would not do procedure free of charge as we are being asked. There does not appear to be malpractice or standard of care issue - the consent on file clearly states assymetry and all issues could happen - and that  if second surgery is needed charges would be likely.

## 2022-11-08 ENCOUNTER — Other Ambulatory Visit (INDEPENDENT_AMBULATORY_CARE_PROVIDER_SITE_OTHER): Payer: BC Managed Care – PPO

## 2022-11-08 ENCOUNTER — Other Ambulatory Visit: Payer: Self-pay | Admitting: Endocrinology

## 2022-11-08 DIAGNOSIS — E119 Type 2 diabetes mellitus without complications: Secondary | ICD-10-CM

## 2022-11-08 LAB — LIPID PANEL
Cholesterol: 143 mg/dL (ref 0–200)
HDL: 64.4 mg/dL (ref >39.00)
LDL Cholesterol: 50 mg/dL (ref 0–99)
NonHDL: 78.88
Total CHOL/HDL Ratio: 2
Triglycerides: 146 mg/dL (ref 0.0–149.0)
VLDL: 29.2 mg/dL (ref 0.0–40.0)

## 2022-11-08 LAB — BASIC METABOLIC PANEL
BUN: 13 mg/dL (ref 6–23)
CO2: 27 mEq/L (ref 19–32)
Calcium: 8.9 mg/dL (ref 8.4–10.5)
Chloride: 105 mEq/L (ref 96–112)
Creatinine, Ser: 0.72 mg/dL (ref 0.40–1.20)
GFR: 93.55 mL/min (ref >60.00)
Glucose, Bld: 99 mg/dL (ref 70–99)
Potassium: 3.8 mEq/L (ref 3.5–5.1)
Sodium: 141 mEq/L (ref 135–145)

## 2022-11-08 LAB — HEMOGLOBIN A1C: Hgb A1c MFr Bld: 5.6 % (ref 4.6–6.5)

## 2022-11-09 LAB — FRUCTOSAMINE: Fructosamine: 218 umol/L (ref 0–285)

## 2022-11-11 ENCOUNTER — Encounter: Payer: Self-pay | Admitting: Endocrinology

## 2022-11-11 ENCOUNTER — Ambulatory Visit: Payer: BC Managed Care – PPO | Admitting: Endocrinology

## 2022-11-11 VITALS — BP 122/80 | HR 90 | Ht 60.0 in | Wt 191.4 lb

## 2022-11-11 DIAGNOSIS — I10 Essential (primary) hypertension: Secondary | ICD-10-CM | POA: Diagnosis not present

## 2022-11-11 DIAGNOSIS — E119 Type 2 diabetes mellitus without complications: Secondary | ICD-10-CM

## 2022-11-11 DIAGNOSIS — E7800 Pure hypercholesterolemia, unspecified: Secondary | ICD-10-CM | POA: Diagnosis not present

## 2022-11-11 DIAGNOSIS — E039 Hypothyroidism, unspecified: Secondary | ICD-10-CM

## 2022-11-11 LAB — TSH: TSH: 1.73 u[IU]/mL (ref 0.35–5.50)

## 2022-11-11 NOTE — Progress Notes (Signed)
Patient ID: Jordan Holloway, female   DOB: [birth year], 57 y.o.   MRN: [id number]           Reason for Appointment: Type II Diabetes follow-up   History of Present Illness   Diagnosis date: 2009  Previous history:   She was treated with metformin during her pregnancy when she had gestational diabetes in 2009 Subsequently she was told that she had diabetes again and was started back on metformin  Her maximum weight previously was 270 had gastric banding in 2011  Subsequently Trulicity was added in 4854, not clear if this is for weight loss or diabetes since her A1c has been 5.7 or lower since 2019 Since 2023 she has been on Ozempic 1 mg weekly  A1c range in the last few years is: 5.1-6.3  Recent history:     Non-insulin hypoglycemic drugs: Ozempic 2 mg weekly and metformin 500 mg daily.       Side effects from medications: None  Current self management, blood sugar patterns and problems identified:  A1c is 5.6, previously 5.4, fructosamine 218 She does not monitor her blood sugars at home Also is not doing much exercise because of knee pain Currently her fasting glucose was 99 Recently she is trying to follow a low carbohydrate high-protein diet with increased fiber also She says because of difficulty losing weight she asked her gynecologist to increase her Ozempic to 2 mg she is just starting to do this Nausea with Ozempic  Exercise: some walking on treadmill  Diet management: Recently trying to increase protein, reduce carbohydrate and increase vegetables and salads     Hypoglycemia:  none    Glucometer: none          Dietician visit: Most recent: 2018     Weight control: Max 270  Wt Readings from Last 3 Encounters:  11/11/22 191 lb 6.4 oz (86.8 kg)  08/10/22 188 lb (85.3 kg)  12/10/21 186 lb (84.4 kg)            Diabetes labs:  Lab Results  Component Value Date   HGBA1C 5.6 11/08/2022   HGBA1C 5.4 11/11/2021   HGBA1C 5.1 12/17/2020   Lab Results   Component Value Date   MICROALBUR 0.7 04/27/2016   LDLCALC 50 11/08/2022   CREATININE 0.72 11/08/2022    Lab Results  Component Value Date   FRUCTOSAMINE 218 11/08/2022     Allergies as of 11/11/2022       Reactions   Sulfonamide Derivatives    Sulfa Antibiotics Rash        Medication List        Accurate as of November 11, 2022  2:19 PM. If you have any questions, ask your nurse or doctor.          STOP taking these medications    metFORMIN 500 MG tablet Commonly known as: GLUCOPHAGE Stopped by: Elayne Snare, MD       TAKE these medications    ALPRAZolam 1 MG tablet Commonly known as: XANAX Take 1 mg by mouth 3 (three) times daily as needed.   atorvastatin 10 MG tablet Commonly known as: LIPITOR TAKE ONE TABLET BY MOUTH DAILY   levothyroxine 100 MCG tablet Commonly known as: SYNTHROID TAKE ONE TABLET BY MOUTH EVERY MORNING BEFORE BREAKFAST   losartan 25 MG tablet Commonly known as: COZAAR TAKE 1 TABLET BY MOUTH DAILY   omeprazole 40 MG capsule Commonly known as: PRILOSEC Take 40 mg by mouth daily.   ondansetron 4  MG disintegrating tablet Commonly known as: ZOFRAN-ODT Take 1 tablet (4 mg total) by mouth every 8 (eight) hours as needed for nausea or vomiting.   PARoxetine 37.5 MG 24 hr tablet Commonly known as: PAXIL-CR Take 37.5 mg by mouth daily.   Semaglutide (1 MG/DOSE) 4 MG/3ML Sopn Inject 1 mg as directed once a week.   Ozempic (2 MG/DOSE) 8 MG/3ML Sopn Generic drug: Semaglutide (2 MG/DOSE) Inject 2 mg every week by subcutaneous route.        Allergies:  Allergies  Allergen Reactions   Sulfonamide Derivatives    Sulfa Antibiotics Rash    Past Medical History:  Diagnosis Date   Anemia    Anxiety    ASTHMA 06/20/2007   Patient denies    DIABETES MELLITUS, TYPE II 06/20/2007   GERD 06/20/2007   Heart murmur    History of migraine headaches    HYPERCHOLESTEROLEMIA 01/05/2010   HYPERTENSION 11/15/2008   HYPOTHYROIDISM  01/26/2008   Polycystic ovary syndrome     Past Surgical History:  Procedure Laterality Date   Broken Clavicle     CESAREAN SECTION     2   Head Injury     KNEE SURGERY     LAPAROSCOPIC GASTRIC BANDING  05/13/2009   Dr. [facility] [place] [facility]    Family History  Problem Relation Age of Onset   Hypertension Mother    Hypertension Father    Colon cancer Neg Hx    Esophageal cancer Neg Hx    Rectal cancer Neg Hx    Stomach cancer Neg Hx    Diabetes Neg Hx     Social History:  reports that she has quit smoking. She has never used smokeless tobacco. She reports current alcohol use. She reports that she does not currently use drugs.  Review of Systems:  Last foot exam date: 2/24  Symptoms of neuropathy: None  Hypertension: This has been minimal ACE/ARB medication: Losartan 25 mg daily  BP Readings from Last 3 Encounters:  11/11/22 122/80  08/10/22 134/83  12/10/21 126/74    Lipid management: LDL controlled with Lipitor 10 mg daily    Lab Results  Component Value Date   CHOL 143 11/08/2022   CHOL 155 12/17/2020   CHOL 166 04/27/2016   Lab Results  Component Value Date   HDL 64.40 11/08/2022   HDL 58.60 12/17/2020   HDL 51.60 04/27/2016   Lab Results  Component Value Date   LDLCALC 50 11/08/2022   LDLCALC 71 12/17/2020   LDLCALC 85 04/27/2016   Lab Results  Component Value Date   TRIG 146.0 11/08/2022   TRIG 125.0 12/17/2020   TRIG 150.0 (H) 04/27/2016   Lab Results  Component Value Date   CHOLHDL 2 11/08/2022   CHOLHDL 3 12/17/2020   CHOLHDL 3 04/27/2016   Lab Results  Component Value Date   LDLDIRECT 157.7 04/22/2011    Unable to calculate Fibrosis 4 Score. Requires ALT, AST, and platelet count within the last 6 months.  Longstanding hypothyroidism at least since 2009, being managed on levothyroxine 100 mcg, has been on the same dose for a few years Takes this regularly in the morning and no recent complaints No recent TSH available  even from outside physicians Previously highest TSH 25.3   Lab Results  Component Value Date   TSH 1.73 11/11/2022   TSH 0.85 12/17/2020   TSH 4.30 04/27/2016      Examination:   BP 122/80 (BP Location: Left Arm, Patient Position: Sitting, Cuff  Size: Normal)   Pulse 90   Ht 5' (1.524 m)   Wt 191 lb 6.4 oz (86.8 kg)   LMP 04/25/2016   SpO2 99%   BMI 37.38 kg/m   Body mass index is 37.38 kg/m.   Thyroid not palpable No peripheral edema Biceps reflexes show normal relaxation  Diabetic Foot Exam - Simple   Simple Foot Form Diabetic Foot exam was performed with the following findings: Yes   Visual Inspection No deformities, no ulcerations, no other skin breakdown bilaterally: Yes Sensation Testing Intact to touch and monofilament testing bilaterally: Yes Pulse Check Posterior Tibialis and Dorsalis pulse intact bilaterally: Yes See comments: Yes Comments Pulses 2/4 bilaterally      ASSESSMENT/ PLAN:    Diabetes/prediabetes with obesity   Current regimen: Ozempic 2 mg weekly and metformin 500 mg daily  See history of present illness for detailed discussion of current diabetes management, blood sugar patterns and problems identified  A1c is normal at 5.6 with fasting glucose 99  Recommendations:  Stop metformin Since she has glucose and A1c in the normal range does not need home monitoring at this point She has been on Ozempic primarily for maintenance of her weight loss and now being prescribed by her gynecologist, okay to continue this because of her history of obesity and current BMI still high at 37 She does need to have regular exercise program and recommended water exercises Discussed normal ranges for glucose and A1c  For her primary hypothyroidism she has been on a stable dose of levothyroxine but needs follow-up thyroid levels which will be ordered today   Hypertension: Well-controlled with 25 mg losartan She needs to collect a urine for  microalbumin  Hypercholesterolemia: Well-controlled with Lipitor 10 mg daily  Total visit time for evaluation and management of multiple problems, review of extensive records, counseling = 40 minutes  Patient Instructions  Water exercises  Stop metformin   Elayne Snare 11/11/2022, 2:19 PM   Addendum: TSH 1.7, to continue same dose

## 2022-11-11 NOTE — Patient Instructions (Addendum)
Water exercises  Stop metformin

## 2022-11-25 ENCOUNTER — Other Ambulatory Visit: Payer: Self-pay

## 2022-11-25 MED ORDER — LEVOTHYROXINE SODIUM 100 MCG PO TABS: 100.0000 ug | ORAL_TABLET | Freq: Every day | ORAL | 0 refills | Status: DC

## 2022-12-10 ENCOUNTER — Encounter: Payer: Self-pay | Admitting: Internal Medicine

## 2023-01-18 ENCOUNTER — Other Ambulatory Visit: Payer: Self-pay | Admitting: Endocrinology

## 2023-02-03 ENCOUNTER — Other Ambulatory Visit: Payer: Self-pay

## 2023-02-03 DIAGNOSIS — E119 Type 2 diabetes mellitus without complications: Secondary | ICD-10-CM

## 2023-02-03 MED ORDER — ATORVASTATIN CALCIUM 10 MG PO TABS: 10.0000 mg | ORAL_TABLET | Freq: Every day | ORAL | 0 refills | Status: DC

## 2023-02-10 ENCOUNTER — Other Ambulatory Visit: Payer: Self-pay | Admitting: Family

## 2023-02-10 DIAGNOSIS — N63 Unspecified lump in unspecified breast: Secondary | ICD-10-CM

## 2023-02-23 ENCOUNTER — Ambulatory Visit
Admission: RE | Admit: 2023-02-23 | Discharge: 2023-02-23 | Disposition: A | Discharge status: Home / Self Care | Payer: BC Managed Care – PPO | Source: Ambulatory Visit | Attending: Family | Admitting: Family

## 2023-02-23 DIAGNOSIS — N63 Unspecified lump in unspecified breast: Secondary | ICD-10-CM

## 2023-02-25 ENCOUNTER — Other Ambulatory Visit: Payer: Self-pay | Admitting: Endocrinology

## 2023-03-02 ENCOUNTER — Encounter: Payer: Self-pay | Admitting: Gastroenterology

## 2023-03-13 ENCOUNTER — Other Ambulatory Visit: Payer: Self-pay | Admitting: Endocrinology

## 2023-03-13 DIAGNOSIS — E119 Type 2 diabetes mellitus without complications: Secondary | ICD-10-CM

## 2023-05-10 ENCOUNTER — Ambulatory Visit: Payer: BC Managed Care – PPO | Admitting: "Endocrinology

## 2023-05-27 ENCOUNTER — Other Ambulatory Visit: Payer: Self-pay | Admitting: Endocrinology

## 2023-07-06 ENCOUNTER — Telehealth: Payer: Self-pay | Admitting: "Endocrinology

## 2023-07-06 NOTE — Telephone Encounter (Signed)
MEDICATION: Ozempic   PHARM[facility]Y:  [facility]  HAS THE PATIENT CONTACTED THEIR PHARMACY?  yes  IS THIS A 90 DAY SUPPLY : yes  IS PATIENT OUT OF MEDICATION: yes  IF NOT; HOW MUCH IS LEFT:   LAST APPOINTMENT DATE: @02 .08.2024  NEXT APPOINTMENT DATE: @10 .10.2024  DO WE HAVE YOUR PERMISSION TO LEAVE A DETAILED MESSAGE?:  OTHER COMMENTS: [phone]   **Let patient know to contact pharmacy at the end of the day to make sure medication is ready. **  ** Please notify patient to allow 48-72 hours to process**  **Encourage patient to contact the pharmacy for refills or they can request refills through [facility]**

## 2023-07-07 MED ORDER — OZEMPIC (2 MG/DOSE) 8 MG/3ML ~~LOC~~ SOPN: PEN_INJECTOR | SUBCUTANEOUS | 1 refills | Status: DC

## 2023-07-07 NOTE — Telephone Encounter (Signed)
done

## 2023-07-14 ENCOUNTER — Ambulatory Visit: Payer: BC Managed Care – PPO | Admitting: "Endocrinology

## 2023-07-14 ENCOUNTER — Encounter: Payer: Self-pay | Admitting: "Endocrinology

## 2023-07-14 VITALS — BP 140/90 | HR 86 | Ht 60.0 in | Wt 189.2 lb

## 2023-07-14 DIAGNOSIS — Z6836 Body mass index (BMI) 36.0-36.9, adult: Secondary | ICD-10-CM

## 2023-07-14 DIAGNOSIS — Z7985 Long-term (current) use of injectable non-insulin antidiabetic drugs: Secondary | ICD-10-CM | POA: Diagnosis not present

## 2023-07-14 DIAGNOSIS — E1165 Type 2 diabetes mellitus with hyperglycemia: Secondary | ICD-10-CM

## 2023-07-14 DIAGNOSIS — E66812 Obesity, class 2: Secondary | ICD-10-CM | POA: Diagnosis not present

## 2023-07-14 DIAGNOSIS — E7800 Pure hypercholesterolemia, unspecified: Secondary | ICD-10-CM

## 2023-07-14 LAB — POCT GLYCOSYLATED HEMOGLOBIN (HGB A1C): Hemoglobin A1C: 5.6 % (ref 4.0–5.6)

## 2023-07-14 MED ORDER — LANCETS MISC. MISC: 1.0000 | Freq: Three times a day (TID) | 3 refills | Status: AC

## 2023-07-14 MED ORDER — LANCET DEVICE MISC: 1.0000 | Freq: Three times a day (TID) | 0 refills | Status: AC

## 2023-07-14 MED ORDER — BLOOD GLUCOSE MONITORING SUPPL DEVI: 1.0000 | Freq: Three times a day (TID) | 0 refills | Status: AC

## 2023-07-14 MED ORDER — OZEMPIC (2 MG/DOSE) 8 MG/3ML ~~LOC~~ SOPN: PEN_INJECTOR | SUBCUTANEOUS | 6 refills | Status: DC

## 2023-07-14 MED ORDER — BLOOD GLUCOSE TEST VI STRP: 1.0000 | ORAL_STRIP | Freq: Three times a day (TID) | 3 refills | Status: AC

## 2023-07-14 NOTE — Progress Notes (Signed)
Outpatient Endocrinology Note Jordan Lakeside, MD  07/14/23   Jordan Holloway Mar 24, 1966 [id number]  Referring Provider: Center, [facility] Primary Care Provider: Center, [facility] Reason for consultation: Subjective   Assessment & Plan  Diagnoses and all orders for this visit:  Type 2 diabetes mellitus with hyperglycemia, unspecified whether long term insulin use (HCC) -     POCT glycosylated hemoglobin (Hb A1C)  Pure hypercholesterolemia  Class 2 severe obesity due to excess calories with serious comorbidity and body mass index (BMI) of 36.0 to 36.9 in adult [facility])  Other orders -     Blood Glucose Monitoring Suppl DEVI; 1 each by Does not apply route in the morning, at noon, and at bedtime. May substitute to any manufacturer covered by patient's insurance. -     Glucose Blood (BLOOD GLUCOSE TEST STRIPS) STRP; 1 each by In Vitro route in the morning, at noon, and at bedtime. May substitute to any manufacturer covered by patient's insurance. -     Lancet Device MISC; 1 each by Does not apply route in the morning, at noon, and at bedtime. May substitute to any manufacturer covered by patient's insurance. -     Lancets Misc. MISC; 1 each by Does not apply route in the morning, at noon, and at bedtime. May substitute to any manufacturer covered by patient's insurance. -     OZEMPIC, 2 MG/DOSE, 8 MG/3ML SOPN; Inject 2 mg every week by subcutaneous route.   Diabetes Type II with no complications  Lab Results  Component Value Date   GFR 93.55 11/08/2022   Hba1c goal less than 7, current Hba1c is  Lab Results  Component Value Date   HGBA1C 5.6 07/14/2023   Will recommend the following: Ozempic 2 mg weekly (last time couldn't get it refilled) Stopped metformin   No known contraindications/side effects to any of above medications  -Last LD and Tg are as follows: Lab Results  Component Value Date   LDLCALC 50 11/08/2022    Lab Results  Component Value Date    TRIG 146.0 11/08/2022   -On atorvastatin 10 mg QD -Follow low fat diet and exercise   -Blood pressure goal <140/90 - Microalbumin/creatinine goal is < 30 -Last MA/Cr is as follows: Lab Results  Component Value Date   MICROALBUR 0.7 04/27/2016   -on ACE/ARB losartan 25 mg qd -diet changes including salt restriction -limit eating outside -counseled BP targets per standards of diabetes care -uncontrolled blood pressure can lead to retinopathy, nephropathy and cardiovascular and atherosclerotic heart disease  Reviewed and counseled on: -A1C target -Blood sugar targets -Complications of uncontrolled diabetes  -Checking blood sugar before meals and bedtime and bring log next visit -All medications with mechanism of action and side effects -Hypoglycemia management: rule of 15's, Glucagon Emergency Kit and medical alert ID -low-carb low-fat plate-method diet -At least 20 minutes of physical activity per day -Annual dilated retinal eye exam and foot exam -compliance and follow up needs -follow up as scheduled or earlier if problem gets worse  Call if blood sugar is less than 70 or consistently above 250    Take a 15 gm snack of carbohydrate at bedtime before you go to sleep if your blood sugar is less than 100.    If you are going to fast after midnight for a test or procedure, ask your physician for instructions on how to reduce/decrease your insulin dose.    Call if blood sugar is less than 70 or consistently above  250  -Treating a low sugar by rule of 15  (15 gms of sugar every 15 min until sugar is more than 70) If you feel your sugar is low, test your sugar to be sure If your sugar is low (less than 70), then take 15 grams of a fast acting Carbohydrate (3-4 glucose tablets or glucose gel or 4 ounces of juice or regular soda) Recheck your sugar 15 min after treating low to make sure it is more than 70 If sugar is still less than 70, treat again with 15 grams of carbohydrate           Don't drive the hour of hypoglycemia  If unconscious/unable to eat or drink by mouth, use glucagon injection or nasal spray baqsimi and call 911. Can repeat again in 15 min if still unconscious.  Return in about 6 months (around 01/12/2024).   I have reviewed current medications, nurse's notes, allergies, vital signs, past medical and surgical history, family medical history, and social history for this encounter. Counseled patient on symptoms, examination findings, lab findings, imaging results, treatment decisions and monitoring and prognosis. The patient understood the recommendations and agrees with the treatment plan. All questions regarding treatment plan were fully answered.  Jordan Alva, MD  07/14/23    History of Present Illness Jordan Holloway is a 57 y.o. year old female who presents for evaluation of Type II diabetes mellitus.  Jordan Holloway was first diagnosed in 2009.   Diabetes education +  Previous history:  She was treated with metformin during her pregnancy when she had gestational diabetes in 2009 Subsequently she was told that she had diabetes again and was started back on metformin  Her maximum weight previously was 270 had gastric banding in 2011  Subsequently Trulicity was added in 2021, not clear if this is for weight loss or diabetes since her A1c has been 5.7 or lower since 2019 Since 2023 she has been on Ozempic 1 mg weekly  Home diabetes regimen: Ozempic 2 mg weekly  COMPLICATIONS -  MI/Stroke -  retinopathy -  neuropathy -  nephropathy  SYMPTOMS REVIEWED - Polyuria - Weight loss - Blurred vision  BLOOD SUGAR DATA Doesn't check at home   Physical Exam  BP (!) 140/90   Pulse 86   Ht 5' (1.524 m)   Wt 189 lb 3.2 oz (85.8 kg)   LMP 04/25/2016   SpO2 97%   BMI 36.95 kg/m    Constitutional: well developed, well nourished Head: normocephalic, atraumatic Eyes: sclera anicteric, no redness Neck: supple Lungs: normal  respiratory effort Neurology: alert and oriented Skin: dry, no appreciable rashes Musculoskeletal: no appreciable defects Psychiatric: normal mood and affect Diabetic Foot Exam - Simple   No data filed      Current Medications Patient's Medications  New Prescriptions   BLOOD GLUCOSE MONITORING SUPPL DEVI    1 each by Does not apply route in the morning, at noon, and at bedtime. May substitute to any manufacturer covered by patient's insurance.   GLUCOSE BLOOD (BLOOD GLUCOSE TEST STRIPS) STRP    1 each by In Vitro route in the morning, at noon, and at bedtime. May substitute to any manufacturer covered by patient's insurance.   LANCET DEVICE MISC    1 each by Does not apply route in the morning, at noon, and at bedtime. May substitute to any manufacturer covered by patient's insurance.   LANCETS MISC. MISC    1 each by Does not apply route  in the morning, at noon, and at bedtime. May substitute to any manufacturer covered by patient's insurance.  Previous Medications   ALPRAZOLAM (XANAX) 1 MG TABLET    Take 1 mg by mouth 3 (three) times daily as needed.   ATORVASTATIN (LIPITOR) 10 MG TABLET    TAKE 1 TABLET BY MOUTH DAILY   LEVOTHYROXINE (SYNTHROID) 100 MCG TABLET    TAKE 1 TABLET BY MOUTH DAILY WITH BREAKFAST   LOSARTAN (COZAAR) 25 MG TABLET    TAKE 1 TABLET BY MOUTH DAILY   OMEPRAZOLE (PRILOSEC) 40 MG CAPSULE    Take 40 mg by mouth daily.   ONDANSETRON (ZOFRAN-ODT) 4 MG DISINTEGRATING TABLET    Take 1 tablet (4 mg total) by mouth every 8 (eight) hours as needed for nausea or vomiting.   PAROXETINE (PAXIL-CR) 37.5 MG 24 HR TABLET    Take 37.5 mg by mouth daily.  Modified Medications   Modified Medication Previous Medication   OZEMPIC, 2 MG/DOSE, 8 MG/3ML SOPN OZEMPIC, 2 MG/DOSE, 8 MG/3ML SOPN      Inject 2 mg every week by subcutaneous route.    Inject 2 mg every week by subcutaneous route.  Discontinued Medications   SEMAGLUTIDE, 1 MG/DOSE, 4 MG/3ML SOPN    Inject 1 mg as directed once  a week.    Allergies Allergies  Allergen Reactions   Sulfonamide Derivatives    Sulfa Antibiotics Rash    Past Medical History Past Medical History:  Diagnosis Date   Anemia    Anxiety    ASTHMA 06/20/2007   Patient denies    DIABETES MELLITUS, TYPE II 06/20/2007   GERD 06/20/2007   Heart murmur    History of migraine headaches    HYPERCHOLESTEROLEMIA 01/05/2010   HYPERTENSION 11/15/2008   HYPOTHYROIDISM 01/26/2008   Polycystic ovary syndrome     Past Surgical History Past Surgical History:  Procedure Laterality Date   Broken Clavicle     CESAREAN SECTION     2   Head Injury     KNEE SURGERY     LAPAROSCOPIC GASTRIC BANDING  05/13/2009   Dr. [facility] [facility]    Family History family history includes Hypertension in her father and mother.  Social History Social History   Socioeconomic History   Marital status: Married    Spouse name: Not on file   Number of children: Not on file   Years of education: Not on file   Highest education level: Not on file  Occupational History   Not on file  Tobacco Use   Smoking status: Former   Smokeless tobacco: Never   Tobacco comments:    Quit 26 years ago.  Vaping Use   Vaping status: Never Used  Substance and Sexual Activity   Alcohol use: Yes    Comment: rarely   Drug use: Not Currently   Sexual activity: Not on file  Other Topics Concern   Not on file  Social History Narrative   Not on file   Social Determinants of Health   Financial Resource Strain: Not on file  Food Insecurity: Not on file  Transportation Needs: Not on file  Physical Activity: Not on file  Stress: Not on file  Social Connections: Not on file  Intimate Partner Violence: Not on file    Lab Results  Component Value Date   HGBA1C 5.6 07/14/2023   HGBA1C 5.6 11/08/2022   HGBA1C 5.4 11/11/2021   Lab Results  Component Value Date   CHOL 143 11/08/2022  Lab Results  Component Value Date   HDL 64.40 11/08/2022   Lab  Results  Component Value Date   LDLCALC 50 11/08/2022   Lab Results  Component Value Date   TRIG 146.0 11/08/2022   Lab Results  Component Value Date   CHOLHDL 2 11/08/2022   Lab Results  Component Value Date   CREATININE 0.72 11/08/2022   Lab Results  Component Value Date   GFR 93.55 11/08/2022   Lab Results  Component Value Date   MICROALBUR 0.7 04/27/2016      Component Value Date/Time   NA 141 11/08/2022 1018   K 3.8 11/08/2022 1018   CL 105 11/08/2022 1018   CO2 27 11/08/2022 1018   GLUCOSE 99 11/08/2022 1018   BUN 13 11/08/2022 1018   CREATININE 0.72 11/08/2022 1018   CREATININE 0.72 09/11/2012 1606   CALCIUM 8.9 11/08/2022 1018   PROT 7.6 05/09/2009 1140   ALBUMIN 3.7 05/09/2009 1140   AST 109 (H) 05/09/2009 1140   ALT 118 (H) 05/09/2009 1140   ALKPHOS 51 05/09/2009 1140   BILITOT 0.4 05/09/2009 1140   GFRNONAA >60 05/09/2009 1140   GFRAA  05/09/2009 1140    >60        The eGFR has been calculated using the MDRD equation. This calculation has not been validated in all clinical situations. eGFR's persistently <60 mL/min signify possible Chronic Kidney Disease.      Latest Ref Rng & Units 11/08/2022   10:18 AM 12/17/2020    4:05 PM 04/27/2016    4:00 PM  BMP  Glucose 70 - 99 mg/dL 99  95  94   BUN 6 - 23 mg/dL 13  26  12    Creatinine 0.40 - 1.20 mg/dL 1.61  0.96  0.45   Sodium 135 - 145 mEq/L 141  138  137   Potassium 3.5 - 5.1 mEq/L 3.8  3.8  3.9   Chloride 96 - 112 mEq/L 105  101  104   CO2 19 - 32 mEq/L 27  28  28    Calcium 8.4 - 10.5 mg/dL 8.9  9.4  9.2        Component Value Date/Time   WBC 8.7 01/15/2010 1405   RBC 4.34 01/15/2010 1405   HGB 12.1 01/15/2010 1405   HCT 36.9 01/15/2010 1405   PLT 248 01/15/2010 1405   MCV 85.1 01/15/2010 1405   MCHC 32.9 01/15/2010 1405   RDW 18.0 (H) 01/15/2010 1405   LYMPHSABS 1.3 05/14/2009 0455   MONOABS 0.5 05/14/2009 0455   EOSABS 0.0 05/14/2009 0455   BASOSABS 0.0 05/14/2009 0455      Parts of this note may have been dictated using voice recognition software. There may be variances in spelling and vocabulary which are unintentional. Not all errors are proofread. Please notify the Thereasa Parkin if any discrepancies are noted or if the meaning of any statement is not clear.

## 2023-07-22 ENCOUNTER — Telehealth: Payer: Self-pay

## 2023-07-22 ENCOUNTER — Other Ambulatory Visit (HOSPITAL_COMMUNITY): Payer: Self-pay

## 2023-07-22 NOTE — Telephone Encounter (Signed)
Pharmacy Patient Advocate Encounter   Received notification from Pt Calls Messages that prior authorization for Ozempic is required/requested.  Per PA form:   PA will be denied without an A1c of 6.5 or above. I am unable to find documentation that she meets that requirement.

## 2023-07-22 NOTE — Telephone Encounter (Signed)
Per Patient the Ozempic 2mg  is requiring a prior authorization , thanks

## 2023-07-25 NOTE — Telephone Encounter (Signed)
5.7 is the highest I see

## 2023-08-11 ENCOUNTER — Other Ambulatory Visit: Payer: Self-pay

## 2023-08-11 DIAGNOSIS — E119 Type 2 diabetes mellitus without complications: Secondary | ICD-10-CM

## 2023-08-11 MED ORDER — ATORVASTATIN CALCIUM 10 MG PO TABS
10.0000 mg | ORAL_TABLET | Freq: Every day | ORAL | 0 refills | Status: DC
Start: 2023-08-11 — End: 2023-11-07

## 2023-08-22 ENCOUNTER — Telehealth: Payer: Self-pay

## 2023-08-22 DIAGNOSIS — E1165 Type 2 diabetes mellitus with hyperglycemia: Secondary | ICD-10-CM

## 2023-08-22 NOTE — Telephone Encounter (Signed)
I spoke to [place] and she is going to call the insurance company and let me know

## 2023-08-22 NOTE — Telephone Encounter (Signed)
Jordan Holloway is calling stating that her insurance is no longer paying for her Ozempic and she is stating that she has been without the injection for 3 months now and has gained 10 pounds since , she is asking what else can she do?

## 2023-08-30 ENCOUNTER — Other Ambulatory Visit: Payer: Self-pay

## 2023-08-30 DIAGNOSIS — E039 Hypothyroidism, unspecified: Secondary | ICD-10-CM

## 2023-08-30 MED ORDER — LEVOTHYROXINE SODIUM 100 MCG PO TABS
100.0000 ug | ORAL_TABLET | Freq: Every day | ORAL | 0 refills | Status: DC
Start: 2023-08-30 — End: 2023-11-24

## 2023-09-13 ENCOUNTER — Telehealth: Payer: Self-pay

## 2023-09-13 NOTE — Telephone Encounter (Signed)
Mrs Kooiman Has type 2 diabetes and history of blood glucoses over 6.5

## 2023-09-13 NOTE — Telephone Encounter (Signed)
Patient is stating that her Ozempic needs a prior [place]

## 2023-09-23 ENCOUNTER — Other Ambulatory Visit: Payer: Self-pay

## 2023-09-23 DIAGNOSIS — E1165 Type 2 diabetes mellitus with hyperglycemia: Secondary | ICD-10-CM

## 2023-09-23 MED ORDER — OZEMPIC (2 MG/DOSE) 8 MG/3ML ~~LOC~~ SOPN: PEN_INJECTOR | SUBCUTANEOUS | 6 refills | Status: DC

## 2023-09-29 ENCOUNTER — Telehealth: Payer: Self-pay

## 2023-09-29 NOTE — Telephone Encounter (Addendum)
Patients insurance states that she has a new dx of DM and has a history of A1C being 6.5 or higher and they will pay.   This PA is for ozempic. The number to call is [phone].

## 2023-09-29 NOTE — Telephone Encounter (Signed)
Patient states she got a bill for a no show. She states that she never got a reminder and now she is asking for that fee to be waived.

## 2023-09-29 NOTE — Telephone Encounter (Signed)
Message sent to PA Team

## 2023-10-04 ENCOUNTER — Telehealth: Payer: Self-pay

## 2023-10-04 ENCOUNTER — Other Ambulatory Visit (HOSPITAL_COMMUNITY): Payer: Self-pay

## 2023-10-04 ENCOUNTER — Other Ambulatory Visit: Payer: Self-pay | Admitting: "Endocrinology

## 2023-10-04 NOTE — Telephone Encounter (Signed)
 Pharmacy Patient Advocate Encounter   Received notification from Pt Calls Messages that prior authorization for Ozempic  is required/requested.   Insurance verification completed.   The patient is insured through [facility] .   Per test claim: PA required; PA submitted to above mentioned insurance via CoverMyMeds Key/confirmation #/EOC [id number] Status is pending

## 2023-10-04 NOTE — Telephone Encounter (Signed)
 Pharmacy Patient Advocate Encounter   Received notification from Pt Calls Messages that prior authorization for Ozempic  is required/requested.   I see notes that the pt has a new diagnosis of DM, however I still am unable to find any chart notes where her A1c was above 6.5. The higest I can find is 6.3, and that will not be accepted, unfortunately. If you are able to point me in the direction where I can find labs that support the diagnosis, then I can submit the PA

## 2023-10-04 NOTE — Telephone Encounter (Signed)
 Patient called wanting to check the status of this PA  I did advise the patient of the information below and she just keeps pushing back at me stating why will they not cover this this because I have had an A1c higher in the past. I did advise to her all of the A1c that I can see up until 5 years and none  of them are at 6.5 or above. Then she proceeds to say So you are telling me that your office is not going to do the PA? I advised to her that the PA has been started but her insurance is probably not going to cover it due to her A1c.    Please push PA and see (Pts request)

## 2023-10-10 NOTE — Telephone Encounter (Signed)
 Pharmacy Patient Advocate Encounter  Received notification from [facility] [place] [facility] that Prior Authorization for OZempic has been DENIED.  Full denial letter will be uploaded to the media tab. See denial reason below.

## 2023-11-01 ENCOUNTER — Other Ambulatory Visit: Payer: Self-pay

## 2023-11-01 DIAGNOSIS — E1165 Type 2 diabetes mellitus with hyperglycemia: Secondary | ICD-10-CM

## 2023-11-01 DIAGNOSIS — I10 Essential (primary) hypertension: Secondary | ICD-10-CM

## 2023-11-01 MED ORDER — LOSARTAN POTASSIUM 25 MG PO TABS: 25.0000 mg | ORAL_TABLET | Freq: Every day | ORAL | 1 refills | Status: DC

## 2023-11-05 ENCOUNTER — Other Ambulatory Visit: Payer: Self-pay | Admitting: "Endocrinology

## 2023-11-05 DIAGNOSIS — E119 Type 2 diabetes mellitus without complications: Secondary | ICD-10-CM

## 2023-11-24 ENCOUNTER — Other Ambulatory Visit: Payer: Self-pay | Admitting: "Endocrinology

## 2023-11-24 DIAGNOSIS — E039 Hypothyroidism, unspecified: Secondary | ICD-10-CM

## 2024-01-12 ENCOUNTER — Ambulatory Visit: Payer: BC Managed Care – PPO | Admitting: "Endocrinology

## 2024-01-30 ENCOUNTER — Telehealth: Payer: Self-pay | Admitting: "Endocrinology

## 2024-01-30 NOTE — Telephone Encounter (Signed)
 Patient is calling to ask if she is still considered a type 2 diabetic.  Patient states that she was told by Dr. Hubert Madden that she was no longer considered  a type 2 diabetic.  Patient wants to know from Dr. Vertell Gory if she is still considered a type 2  diabetic.

## 2024-01-31 NOTE — Telephone Encounter (Signed)
 Spoke to pt, pt state's that she is not taking Ozempic  due to insurance not approving it because she is not consider a type II diabetic.  Please advise?

## 2024-01-31 NOTE — Telephone Encounter (Signed)
 Spoke to pt regarding A1C, Informed pt that since her A1c has not been greater than 6.5 in the last 3 months that she is not considered a type II diabetic.

## 2024-02-07 ENCOUNTER — Other Ambulatory Visit: Payer: Self-pay | Admitting: "Endocrinology

## 2024-02-07 DIAGNOSIS — E119 Type 2 diabetes mellitus without complications: Secondary | ICD-10-CM

## 2024-02-07 NOTE — Telephone Encounter (Signed)
Requested Prescriptions     Pending Prescriptions Disp Refills    atorvastatin (LIPITOR) 10 MG tablet [Pharmacy Med Name: ATORVASTATIN 10 MG TABLET] 90 tablet 0     Sig: TAKE 1 TABLET BY MOUTH DAILY

## 2024-02-19 ENCOUNTER — Other Ambulatory Visit: Payer: Self-pay | Admitting: "Endocrinology

## 2024-02-19 DIAGNOSIS — E039 Hypothyroidism, unspecified: Secondary | ICD-10-CM

## 2024-02-21 NOTE — Telephone Encounter (Signed)
 Requested Prescriptions   Pending Prescriptions Disp Refills   levothyroxine  (SYNTHROID ) 100 MCG tablet [Pharmacy Med Name: LEVOTHYROXINE  100 MCG TABLET] 90 tablet 0    Sig: TAKE 1 TABLET BY MOUTH DAILY WITH BREAKFAST

## 2024-04-27 ENCOUNTER — Other Ambulatory Visit: Payer: Self-pay | Admitting: "Endocrinology

## 2024-04-27 ENCOUNTER — Telehealth: Payer: Self-pay

## 2024-04-27 DIAGNOSIS — E1165 Type 2 diabetes mellitus with hyperglycemia: Secondary | ICD-10-CM

## 2024-04-27 DIAGNOSIS — I10 Essential (primary) hypertension: Secondary | ICD-10-CM

## 2024-04-27 NOTE — Telephone Encounter (Signed)
 Refill request complete

## 2024-04-27 NOTE — Telephone Encounter (Signed)
 Pharmacy requested refill, patient needs an appointment. VM and/or Mychart message sent. Month supply sent.

## 2024-04-30 ENCOUNTER — Telehealth: Payer: Self-pay | Admitting: "Endocrinology

## 2024-04-30 NOTE — Telephone Encounter (Signed)
 NA

## 2024-05-07 ENCOUNTER — Other Ambulatory Visit: Payer: Self-pay | Admitting: "Endocrinology

## 2024-05-07 DIAGNOSIS — E119 Type 2 diabetes mellitus without complications: Secondary | ICD-10-CM

## 2024-05-08 NOTE — Telephone Encounter (Signed)
Requested Prescriptions     Pending Prescriptions Disp Refills    atorvastatin (LIPITOR) 10 MG tablet [Pharmacy Med Name: ATORVASTATIN 10 MG TABLET] 90 tablet 0     Sig: TAKE 1 TABLET BY MOUTH DAILY

## 2024-05-09 ENCOUNTER — Encounter: Payer: Self-pay | Admitting: "Endocrinology

## 2024-05-09 ENCOUNTER — Ambulatory Visit: Admitting: "Endocrinology

## 2024-05-09 VITALS — BP 118/82 | HR 65 | Ht 60.0 in | Wt 205.0 lb

## 2024-05-09 DIAGNOSIS — R7309 Other abnormal glucose: Secondary | ICD-10-CM | POA: Diagnosis not present

## 2024-05-09 LAB — POCT GLYCOSYLATED HEMOGLOBIN (HGB A1C): Hemoglobin A1C: 5.8 % — AB (ref 4.0–5.6)

## 2024-05-09 NOTE — Patient Instructions (Signed)
  Goals of DM therapy:  Morning Fasting blood sugar: 80-140  Blood sugar before meals: 80-140 Bed time blood sugar: 100-150  A1C <7%, limited only by hypoglycemia  1.Diabetes medications and their side effects discussed, including hypoglycemia    2. Check blood glucose intermittently   c) Try to check blood glucose before sleeping/in the middle of the night to ensure that it is remaining stable and not dropping less than 100 d) Check blood glucose more often if sick  3. Diet: a) 3 meals per day schedule b: Restrict carbs to 60-70 grams (4 servings) per meal c) Colorful vegetables - 3 servings a day, and low sugar fruit 2 servings/day Plate control method: 1/4 plate protein, 1/4 starch, 1/2 green, yellow, or red vegetables d) Avoid carbohydrate snacks unless hypoglycemic episode, or increased physical activity  4. Regular exercise as tolerated, preferably 3 or more hours a week  5. Hypoglycemia: a)  Do not drive or operate machinery without first testing blood glucose to assure it is over 90 mg%, or if dizzy, lightheaded, not feeling normal, etc, or  if foot or leg is numb or weak. b)  If blood glucose less than 70, take four 5gm Glucose tabs or 15-30 gm Glucose gel.  Repeat every 15 min as needed until blood sugar is >100 mg/dl. If hypoglycemia persists then call 911.   6. Sick day management: a) Check blood glucose more often b) Continue usual therapy if blood sugars are elevated.   7. Contact the doctor immediately if blood glucose is frequently <60 mg/dl, or an episode of severe hypoglycemia occurs (where someone had to give you glucose/  glucagon or if you passed out from a low blood glucose), or if blood glucose is persistently >350 mg/dl, for further management  8. A change in level of physical activity or exercise and a change in diet may also affect your blood sugar. Check blood sugars more often and call if needed.  Instructions: 1. Bring glucose meter, blood glucose  records on every visit for review 2. Continue to follow up with primary care physician and other providers for medical care 3. Yearly eye  and foot exam 4. Please get blood work done prior to the next appointment

## 2024-05-09 NOTE — Progress Notes (Signed)
 Outpatient Endocrinology Note Jordan Birmingham, MD  05/09/24   Jordan Holloway 03/15/1966 [id number]  Referring Provider: [facility], [facility] Primary Care Provider: Center, [facility] Reason for consultation: Subjective   Assessment & Plan  Diagnoses and all orders for this visit:  Elevated hemoglobin A1c -     POCT glycosylated hemoglobin (Hb A1C)  Morbid obesity (HCC)  Diabetes Type II with no complications  Lab Results  Component Value Date   GFR 93.55 11/08/2022   Hba1c goal less than 7, current Hba1c is  Lab Results  Component Value Date   HGBA1C 5.8 (A) 05/09/2024   Will recommend the following: Her maximum weight previously was 270 had gastric banding in 2011 Insurance doesn't approve lap-band  Wegovy  0.25mg /week  Discussed metformin   Discussed diet and exercise at length  Continue ti follow wit me if wegovy  approved, otherwise with PCP for at least q6 mo A1C  Previously on Ozempic  2 mg weekly (last time couldn't get it refilled) Provider stopped metformin    No known contraindications/side effects to any of above medications  -Last LD and Tg are as follows: Lab Results  Component Value Date   LDLCALC 50 11/08/2022    Lab Results  Component Value Date   TRIG 146.0 11/08/2022   -On atorvastatin  10 mg QD -Follow low fat diet and exercise   -Blood pressure goal <140/90 - Microalbumin/creatinine goal is < 30 -Last MA/Cr is as follows: Lab Results  Component Value Date   MICROALBUR 0.66 09/11/2012   -on ACE/ARB losartan  25 mg qd -diet changes including salt restriction -limit eating outside -counseled BP targets per standards of diabetes care -uncontrolled blood pressure can lead to retinopathy, nephropathy and cardiovascular and atherosclerotic heart disease  Reviewed and counseled on: -A1C target -Blood sugar targets -Complications of uncontrolled diabetes  -Checking blood sugar before meals and bedtime and bring log next  visit -All medications with mechanism of action and side effects -Hypoglycemia management: rule of 15's, Glucagon Emergency Kit and medical alert ID -low-carb low-fat plate-method diet -At least 20 minutes of physical activity per day -Annual dilated retinal eye exam and foot exam -compliance and follow up needs -follow up as scheduled or earlier if problem gets worse  Call if blood sugar is less than 70 or consistently above 250    Take a 15 gm snack of carbohydrate at bedtime before you go to sleep if your blood sugar is less than 100.    If you are going to fast after midnight for a test or procedure, ask your physician for instructions on how to reduce/decrease your insulin dose.    Call if blood sugar is less than 70 or consistently above 250  -Treating a low sugar by rule of 15  (15 gms of sugar every 15 min until sugar is more than 70) If you feel your sugar is low, test your sugar to be sure If your sugar is low (less than 70), then take 15 grams of a fast acting Carbohydrate (3-4 glucose tablets or glucose gel or 4 ounces of juice or regular soda) Recheck your sugar 15 min after treating low to make sure it is more than 70 If sugar is still less than 70, treat again with 15 grams of carbohydrate          Don't drive the hour of hypoglycemia  If unconscious/unable to eat or drink by mouth, use glucagon injection or nasal spray baqsimi and call 911. Can repeat again in 15 min  if still unconscious.  Return in about 5 weeks (around 06/13/2024).   I have reviewed current medications, nurse's notes, allergies, vital signs, past medical and surgical history, family medical history, and social history for this encounter. Counseled patient on symptoms, examination findings, lab findings, imaging results, treatment decisions and monitoring and prognosis. The patient understood the recommendations and agrees with the treatment plan. All questions regarding treatment plan were fully  answered.  Jordan Birmingham, MD  05/09/24    History of Present Illness Jordan Holloway is a 58 y.o. year old female who presents for elevated A1C pre-diabetes.  AERITH CANAL was first diagnosed in 2009.   Diabetes education +  Previous history:  She was treated with metformin  during her pregnancy when she had gestational diabetes in 2009 Subsequently she was told that she had diabetes again and was started back on metformin   Her maximum weight previously was 270 had gastric banding in 2011  Subsequently Trulicity  was added in 2021, not clear if this is for weight loss or diabetes since her A1c has been 5.7 or lower since 2019 Since 2023 she has been on Ozempic  1 mg weekly  Home diabetes regimen: None  Previously on Ozempic  2 mg weekly  COMPLICATIONS -  MI/Stroke -  retinopathy -  neuropathy -  nephropathy  BLOOD SUGAR DATA Doesn't check at home   Physical Exam  BP 118/82   Pulse 65   Ht 5' (1.524 m)   Wt 205 lb (93 kg)   LMP 04/25/2016   SpO2 98%   BMI 40.04 kg/m    Constitutional: well developed, well nourished Head: normocephalic, atraumatic Eyes: sclera anicteric, no redness Neck: supple Lungs: normal respiratory effort Neurology: alert and oriented Skin: dry, no appreciable rashes Musculoskeletal: no appreciable defects Psychiatric: normal mood and affect Diabetic Foot Exam - Simple   No data filed      Current Medications Patient's Medications  New Prescriptions   No medications on file  Previous Medications   ALPRAZOLAM (XANAX) 1 MG TABLET    Take 1 mg by mouth 3 (three) times daily as needed.   ATORVASTATIN  (LIPITOR) 10 MG TABLET    TAKE 1 TABLET BY MOUTH DAILY   BLOOD GLUCOSE MONITORING SUPPL DEVI    1 each by Does not apply route in the morning, at noon, and at bedtime. May substitute to any manufacturer covered by patient's insurance.   LEVOTHYROXINE  (SYNTHROID ) 100 MCG TABLET    TAKE 1 TABLET BY MOUTH DAILY WITH BREAKFAST   LOSARTAN   (COZAAR ) 25 MG TABLET    TAKE 1 TABLET BY MOUTH DAILY   OMEPRAZOLE  (PRILOSEC) 40 MG CAPSULE    Take 40 mg by mouth daily.   ONDANSETRON  (ZOFRAN -ODT) 4 MG DISINTEGRATING TABLET    Take 1 tablet (4 mg total) by mouth every 8 (eight) hours as needed for nausea or vomiting.   OZEMPIC , 2 MG/DOSE, 8 MG/3ML SOPN    Inject 2 mg every week by subcutaneous route.   PAROXETINE (PAXIL-CR) 37.5 MG 24 HR TABLET    Take 37.5 mg by mouth daily.  Modified Medications   No medications on file  Discontinued Medications   No medications on file    Allergies Allergies  Allergen Reactions   Sulfonamide Derivatives    Sulfa Antibiotics Rash    Past Medical History Past Medical History:  Diagnosis Date   Anemia    Anxiety    ASTHMA 06/20/2007   Patient denies    DIABETES MELLITUS, TYPE II  06/20/2007   GERD 06/20/2007   Heart murmur    History of migraine headaches    HYPERCHOLESTEROLEMIA 01/05/2010   HYPERTENSION 11/15/2008   HYPOTHYROIDISM 01/26/2008   Polycystic ovary syndrome     Past Surgical History Past Surgical History:  Procedure Laterality Date   Broken Clavicle     CESAREAN SECTION     2   Head Injury     KNEE SURGERY     LAPAROSCOPIC GASTRIC BANDING  05/13/2009   Dr. Lucrezia [facility]    Family History family history includes Hypertension in her father and mother.  Social History Social History   Socioeconomic History   Marital status: Married    Spouse name: Not on file   Number of children: Not on file   Years of education: Not on file   Highest education level: Not on file  Occupational History   Not on file  Tobacco Use   Smoking status: Former   Smokeless tobacco: Never   Tobacco comments:    Quit 26 years ago.  Vaping Use   Vaping status: Never Used  Substance and Sexual Activity   Alcohol use: Yes    Comment: rarely   Drug use: Not Currently   Sexual activity: Not on file  Other Topics Concern   Not on file  Social History Narrative   Not on  file   Social Drivers of Health   Financial Resource Strain: Not on file  Food Insecurity: Not on file  Transportation Needs: Not on file  Physical Activity: Not on file  Stress: Not on file  Social Connections: Not on file  Intimate Partner Violence: Not on file    Lab Results  Component Value Date   HGBA1C 5.8 (A) 05/09/2024   HGBA1C 5.6 07/14/2023   HGBA1C 5.6 11/08/2022   Lab Results  Component Value Date   CHOL 143 11/08/2022   Lab Results  Component Value Date   HDL 64.40 11/08/2022   Lab Results  Component Value Date   LDLCALC 50 11/08/2022   Lab Results  Component Value Date   TRIG 146.0 11/08/2022   Lab Results  Component Value Date   CHOLHDL 2 11/08/2022   Lab Results  Component Value Date   CREATININE 0.72 11/08/2022   Lab Results  Component Value Date   GFR 93.55 11/08/2022   Lab Results  Component Value Date   MICROALBUR 0.66 09/11/2012      Component Value Date/Time   NA 141 11/08/2022 1018   K 3.8 11/08/2022 1018   CL 105 11/08/2022 1018   CO2 27 11/08/2022 1018   GLUCOSE 99 11/08/2022 1018   BUN 13 11/08/2022 1018   CREATININE 0.72 11/08/2022 1018   CREATININE 0.72 09/11/2012 1606   CALCIUM  8.9 11/08/2022 1018   PROT 7.6 05/09/2009 1140   ALBUMIN 3.7 05/09/2009 1140   AST 109 (H) 05/09/2009 1140   ALT 118 (H) 05/09/2009 1140   ALKPHOS 51 05/09/2009 1140   BILITOT 0.4 05/09/2009 1140   GFRNONAA >60 05/09/2009 1140   GFRAA  05/09/2009 1140    >60        The eGFR has been calculated using the MDRD equation. This calculation has not been validated in all clinical situations. eGFR's persistently <60 mL/min signify possible Chronic Kidney Disease.      Latest Ref Rng & Units 11/08/2022   10:18 AM 12/17/2020    4:05 PM 04/27/2016    4:00 PM  BMP  Glucose 70 - 99  mg/dL 99  95  94   BUN 6 - 23 mg/dL 13  26  12    Creatinine 0.40 - 1.20 mg/dL 9.27  9.31  9.16   Sodium 135 - 145 mEq/L 141  138  137   Potassium 3.5 - 5.1 mEq/L  3.8  3.8  3.9   Chloride 96 - 112 mEq/L 105  101  104   CO2 19 - 32 mEq/L 27  28  28    Calcium  8.4 - 10.5 mg/dL 8.9  9.4  9.2        Component Value Date/Time   WBC 8.7 01/15/2010 1405   RBC 4.34 01/15/2010 1405   HGB 12.1 01/15/2010 1405   HCT 36.9 01/15/2010 1405   PLT 248 01/15/2010 1405   MCV 85.1 01/15/2010 1405   MCHC 32.9 01/15/2010 1405   RDW 18.0 (H) 01/15/2010 1405   LYMPHSABS 1.3 05/14/2009 0455   MONOABS 0.5 05/14/2009 0455   EOSABS 0.0 05/14/2009 0455   BASOSABS 0.0 05/14/2009 0455     Parts of this note may have been dictated using voice recognition software. There may be variances in spelling and vocabulary which are unintentional. Not all errors are proofread. Please notify the dino if any discrepancies are noted or if the meaning of any statement is not clear.

## 2024-05-19 ENCOUNTER — Other Ambulatory Visit: Payer: Self-pay | Admitting: "Endocrinology

## 2024-05-19 DIAGNOSIS — E039 Hypothyroidism, unspecified: Secondary | ICD-10-CM

## 2024-06-02 ENCOUNTER — Other Ambulatory Visit: Payer: Self-pay | Admitting: "Endocrinology

## 2024-06-02 DIAGNOSIS — E1165 Type 2 diabetes mellitus with hyperglycemia: Secondary | ICD-10-CM

## 2024-06-02 DIAGNOSIS — I10 Essential (primary) hypertension: Secondary | ICD-10-CM

## 2024-06-11 ENCOUNTER — Other Ambulatory Visit: Payer: Self-pay

## 2024-06-11 DIAGNOSIS — E1165 Type 2 diabetes mellitus with hyperglycemia: Secondary | ICD-10-CM

## 2024-06-11 DIAGNOSIS — I10 Essential (primary) hypertension: Secondary | ICD-10-CM

## 2024-06-11 MED ORDER — LOSARTAN POTASSIUM 25 MG PO TABS: 25.0000 mg | ORAL_TABLET | Freq: Every day | ORAL | 0 refills | Status: DC

## 2024-06-14 ENCOUNTER — Ambulatory Visit: Admitting: "Endocrinology

## 2024-07-07 ENCOUNTER — Other Ambulatory Visit: Payer: Self-pay | Admitting: "Endocrinology

## 2024-07-07 DIAGNOSIS — I10 Essential (primary) hypertension: Secondary | ICD-10-CM

## 2024-07-07 DIAGNOSIS — E1165 Type 2 diabetes mellitus with hyperglycemia: Secondary | ICD-10-CM

## 2024-08-03 ENCOUNTER — Other Ambulatory Visit: Payer: Self-pay | Admitting: "Endocrinology

## 2024-08-03 DIAGNOSIS — E119 Type 2 diabetes mellitus without complications: Secondary | ICD-10-CM

## 2024-08-03 NOTE — Telephone Encounter (Signed)
Requested Prescriptions     Pending Prescriptions Disp Refills    atorvastatin (LIPITOR) 10 MG tablet [Pharmacy Med Name: ATORVASTATIN 10 MG TABLET] 90 tablet 0     Sig: TAKE 1 TABLET BY MOUTH DAILY

## 2024-08-10 ENCOUNTER — Other Ambulatory Visit: Payer: Self-pay | Admitting: "Endocrinology

## 2024-08-10 DIAGNOSIS — E1165 Type 2 diabetes mellitus with hyperglycemia: Secondary | ICD-10-CM

## 2024-08-10 DIAGNOSIS — I10 Essential (primary) hypertension: Secondary | ICD-10-CM

## 2024-08-19 ENCOUNTER — Other Ambulatory Visit: Payer: Self-pay | Admitting: "Endocrinology

## 2024-08-19 DIAGNOSIS — E1165 Type 2 diabetes mellitus with hyperglycemia: Secondary | ICD-10-CM

## 2024-08-19 DIAGNOSIS — I10 Essential (primary) hypertension: Secondary | ICD-10-CM

## 2024-08-20 ENCOUNTER — Other Ambulatory Visit: Payer: Self-pay

## 2024-08-20 DIAGNOSIS — E1165 Type 2 diabetes mellitus with hyperglycemia: Secondary | ICD-10-CM

## 2024-08-20 DIAGNOSIS — I10 Essential (primary) hypertension: Secondary | ICD-10-CM

## 2024-08-20 NOTE — Telephone Encounter (Signed)
 Ok to refill losartan  under your name?

## 2024-08-22 ENCOUNTER — Other Ambulatory Visit: Payer: Self-pay | Admitting: "Endocrinology

## 2024-08-22 ENCOUNTER — Telehealth: Payer: Self-pay

## 2024-08-22 DIAGNOSIS — I10 Essential (primary) hypertension: Secondary | ICD-10-CM

## 2024-08-22 DIAGNOSIS — E1165 Type 2 diabetes mellitus with hyperglycemia: Secondary | ICD-10-CM

## 2024-08-22 NOTE — Telephone Encounter (Signed)
 Pt is requesting to Dr Dartha to give her a call regarding why she won't refill her losartan .

## 2024-08-22 NOTE — Telephone Encounter (Signed)
 Spoke to pt regarding medication refill for losartan . Pt said that she would get a PCP.

## 2024-09-06 ENCOUNTER — Telehealth: Payer: Self-pay

## 2024-09-06 ENCOUNTER — Other Ambulatory Visit: Payer: Self-pay | Admitting: "Endocrinology

## 2024-09-06 DIAGNOSIS — E1165 Type 2 diabetes mellitus with hyperglycemia: Secondary | ICD-10-CM

## 2024-09-06 DIAGNOSIS — I10 Essential (primary) hypertension: Secondary | ICD-10-CM

## 2024-09-06 MED ORDER — LOSARTAN POTASSIUM 25 MG PO TABS: 25.0000 mg | ORAL_TABLET | Freq: Every day | ORAL | 0 refills | Status: DC

## 2024-09-06 NOTE — Telephone Encounter (Signed)
 Pt called to request a 30 day refill for losartan , pt is not able to see her cardiologist until the end of the month. Pt reports elevated Blood pressure.

## 2024-09-07 ENCOUNTER — Ambulatory Visit

## 2024-09-07 NOTE — Telephone Encounter (Signed)
 Lvm for pt to call back regarding refills.

## 2024-09-11 NOTE — Telephone Encounter (Signed)
 Lvm for pt to call back regarding refills.

## 2024-09-12 NOTE — Telephone Encounter (Signed)
 Pt has been notified regarding medication refill.

## 2024-10-01 ENCOUNTER — Encounter (HOSPITAL_BASED_OUTPATIENT_CLINIC_OR_DEPARTMENT_OTHER): Payer: Self-pay

## 2024-10-02 ENCOUNTER — Ambulatory Visit: Attending: Internal Medicine | Admitting: Internal Medicine

## 2024-10-02 ENCOUNTER — Other Ambulatory Visit: Payer: Self-pay | Admitting: "Endocrinology

## 2024-10-02 VITALS — BP 124/86 | HR 68 | Ht 60.0 in | Wt 200.4 lb

## 2024-10-02 DIAGNOSIS — E7800 Pure hypercholesterolemia, unspecified: Secondary | ICD-10-CM | POA: Diagnosis not present

## 2024-10-02 DIAGNOSIS — E1165 Type 2 diabetes mellitus with hyperglycemia: Secondary | ICD-10-CM

## 2024-10-02 DIAGNOSIS — I10 Essential (primary) hypertension: Secondary | ICD-10-CM

## 2024-10-02 MED ORDER — LOSARTAN POTASSIUM 25 MG PO TABS: 25.0000 mg | ORAL_TABLET | Freq: Every day | ORAL | 3 refills | Status: AC

## 2024-10-02 NOTE — Patient Instructions (Signed)
 Medication Instructions:  *If you need a refill on your cardiac medications before your next appointment, please call your pharmacy*   Follow-Up: At [facility], you and your health needs are our priority.  As part of our continuing mission to provide you with exceptional heart care, our providers are all part of one team.  This team includes your primary Cardiologist (physician) and Advanced Practice Providers or APPs (Physician Assistants and Nurse Practitioners) who all work together to provide you with the care you need, when you need it.  Your next appointment:   1-2 year(s)  Provider:   Dr. Santo

## 2024-10-02 NOTE — Progress Notes (Signed)
" °  Cardiology Office Note:  .    Date:  10/02/2024  ID:  Jordan Holloway, DOB [birth year], MRN [id number] PCP: [facility]  [facility] Providers Cardiologist:  None     CC: HTN Consulted for the evaluation of complex hypertension at the behest of Dr. Mat  History of Present Illness: .    Jordan Holloway is a 58 y.o. female with hypertension and hyperlipidemia who presents for blood pressure management and medication refill. She was referred by Dr. Mat for blood pressure support.  Her blood pressure was previously uncontrolled when her losartan  was not approved, but it is currently well-managed on losartan  25 mg. She is here to have her losartan  refilled.  Her endocrinologist informed her she no longer has diabetes, and thus, her endocrinologist is not comfortable prescribing losartan  anymore.  She is retired from teaching and stays active by walking and using a treadmill, although she has reduced activity during cold weather. 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  No symptoms such as chest pain, palpitations, or shortness of breath.  She takes atorvastatin  for elevated cholesterol and her cholesterol levels have been stable.  Discussed the use of AI scribe software for clinical note transcription with the patient, who gave verbal consent to proceed.   Relevant histories: .  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 She tutors part-time and engages in physical activity such as walking and using a treadmill. She had a knee replacement in January and is adjusting to retirement. ROS: As per HPI.   Physical Exam:    VS:  BP 124/86 (BP Location: Left Arm)   Pulse 68   Ht 5' (1.524 m)   Wt 200 lb 6.4 oz (90.9 kg)   LMP 04/25/2016    SpO2 98%   BMI 39.14 kg/m    Wt Readings from Last 3 Encounters:  10/02/24 200 lb 6.4 oz (90.9 kg)  05/09/24 205 lb (93 kg)  07/14/23 189 lb 3.2 oz (85.8 kg)    Gen: no distress  Morbid obesity Neck: No JVD Cardiac: No Rubs or Gallops, no murmur, RRR +2 radial pulses Respiratory: Clear to auscultation bilaterally, no effort, normal  respiratory rate GI: Soft, nontender, non-distended  MS: No  edema;  moves all extremities Integument: Skin feels warm Neuro:  At time of evaluation, alert and oriented to person/place/time/situation  Psych: Normal affect, patient feels ok     ASSESSMENT AND PLAN: .    HTN - Controlled on current medication, will get BMP   HLD - LDL < 50 on current statin - offered CAC score  Morbid obesity - continue walking and start winter exercise program  Two year f/u or PRN; only need losartan  for BP support this could likely be managed by PCP  Stanly Leavens, MD FASE [facility] Cardiologist [facility]  [address] [place], [place] [address] [phone]  2:28 PM  "

## 2024-10-31 ENCOUNTER — Other Ambulatory Visit: Payer: Self-pay | Admitting: "Endocrinology

## 2024-10-31 DIAGNOSIS — E119 Type 2 diabetes mellitus without complications: Secondary | ICD-10-CM
